# Patient Record
Sex: Female | Born: 1935 | Race: White | Hispanic: No | Smoking: Never smoker
Health system: Southern US, Community
[De-identification: ages and names within clinical notes are randomized; demographics above are authoritative.]

## PROBLEM LIST (undated history)

## (undated) DIAGNOSIS — E785 Hyperlipidemia, unspecified: Secondary | ICD-10-CM

## (undated) DIAGNOSIS — E039 Hypothyroidism, unspecified: Secondary | ICD-10-CM

## (undated) DIAGNOSIS — I10 Essential (primary) hypertension: Secondary | ICD-10-CM

## (undated) HISTORY — DX: Hyperlipidemia, unspecified: E78.5

## (undated) HISTORY — DX: Hypothyroidism, unspecified: E03.9

## (undated) HISTORY — DX: Essential (primary) hypertension: I10

## (undated) HISTORY — PX: TONSILLECTOMY: SUR1361

## (undated) HISTORY — PX: APPENDECTOMY: SHX54

---

## 2002-04-14 ENCOUNTER — Ambulatory Visit (HOSPITAL_COMMUNITY): Admission: RE | Admit: 2002-04-14 | Discharge: 2002-04-14 | Payer: Self-pay | Admitting: Orthopedic Surgery

## 2002-04-16 ENCOUNTER — Inpatient Hospital Stay (HOSPITAL_COMMUNITY): Admission: RE | Admit: 2002-04-16 | Discharge: 2002-04-22 | Payer: Self-pay | Admitting: Orthopedic Surgery

## 2002-04-16 ENCOUNTER — Encounter: Payer: Self-pay | Admitting: Orthopedic Surgery

## 2010-03-08 ENCOUNTER — Encounter: Admission: RE | Admit: 2010-03-08 | Discharge: 2010-03-08 | Payer: Self-pay | Admitting: Sports Medicine

## 2010-03-08 IMAGING — CR DG KNEE 1-2V*L*
2 series · 2 of 2 positions shown · non-contrast
Comparison: None.

CLINICAL DATA: Left knee pain for 2 weeks, no acute injury

LEFT KNEE - 1-2 VIEW

[view not recorded (1 of 2)]
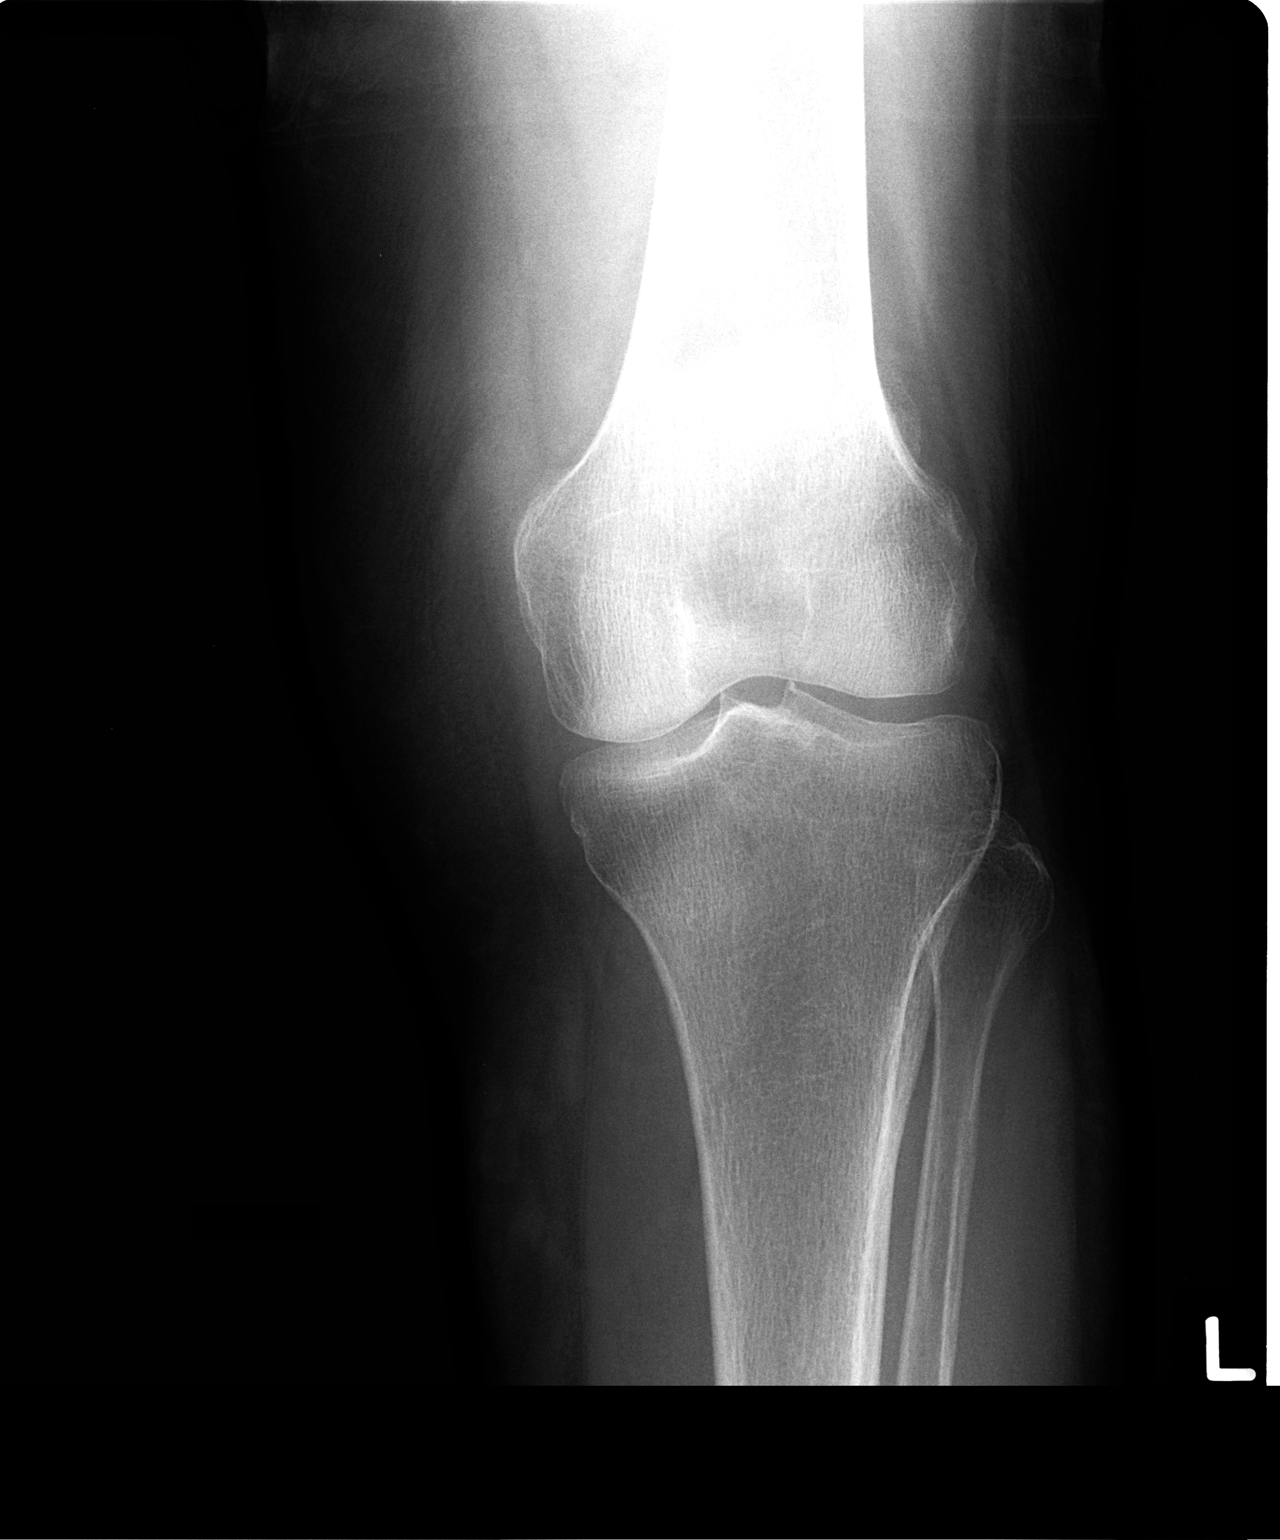

[view not recorded (2 of 2)]
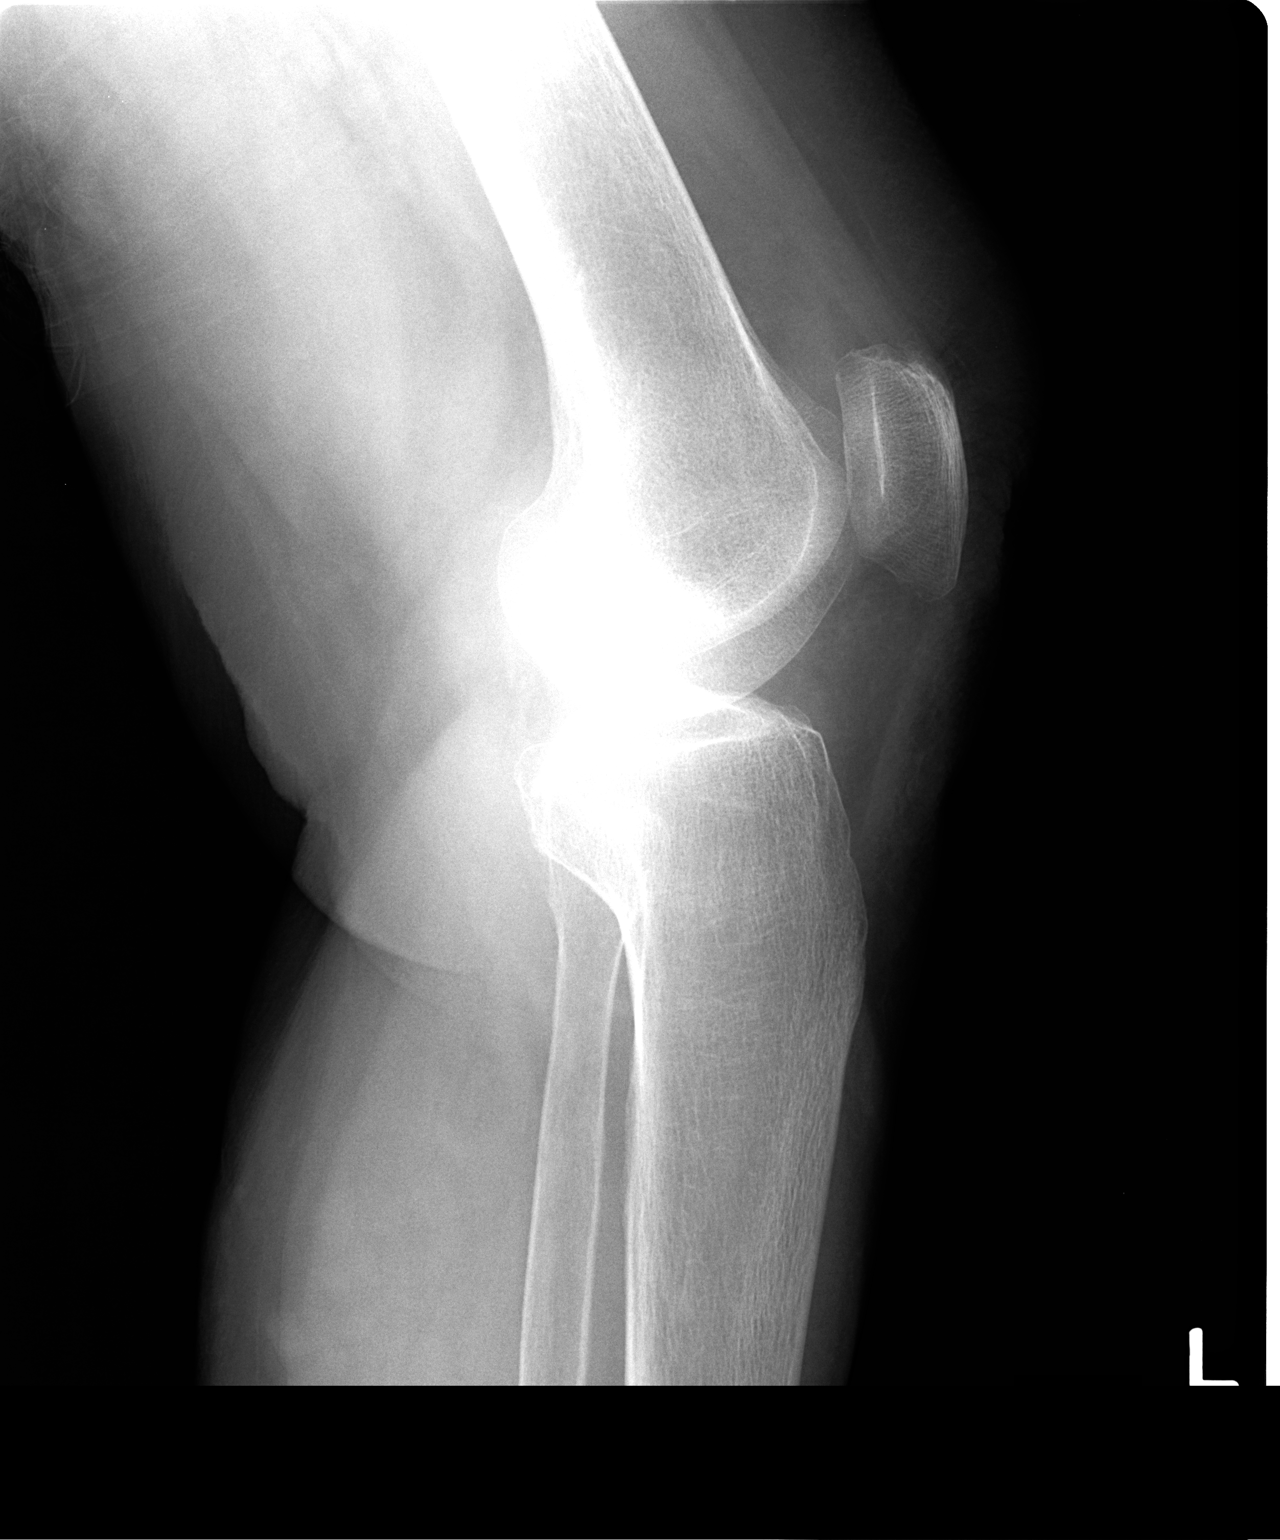

[2 of 2 positions shown; findings below may reference images not displayed]

FINDINGS: The bones are slightly osteopenic.  Joint spaces appear
normal.  No fracture is seen.  No effusion is noted.
IMPRESSION: Mild osteopenia.  No acute abnormality.

## 2010-11-17 NOTE — Op Note (Signed)
[name], [name]                            ACCOUNT NO.:  [id number]   MEDICAL RECORD NO.:  [id number]                   PATIENT TYPE:  INP   LOCATION:  5032                                 FACILITY:  [facility]   PHYSICIAN:  Deidre Ala, M.D.                 DATE OF BIRTH:  07/29/1924   DATE OF PROCEDURE:  04/16/2002  DATE OF DISCHARGE:                                 OPERATIVE REPORT   PREOPERATIVE DIAGNOSES:  1. Left ankle anterior plafond fracture, 50% with displacement.  2. Medial malleolar fracture with displacement.   OPERATION:  1. Left ankle manipulative reduction with open percutaneous cannulated     cancellous screws, three anterior distal tibia front to back plafond.  2. Reduction and two cannulated cancellous screws medial malleolus.  3. Intraoperative fluoroscopy.   SURGEON:  Bradley Ferris, M.D.   ASSISTANT:  Madilyn Fireman, PA-C   ANESTHESIA:  General endotracheal.   CULTURES:  None.   DRAINS:  None.   ESTIMATED BLOOD LOSS:  Minimal.   TOURNIQUET TIME:  47 minutes.   PATHOLOGIC FINDINGS AND HISTORY:  Christina [name] is a 77 year old, who fell off a  ladder five days ago.  She was referred by Stan Head. Cleta Alberts, M.D., [facility].  She was cleaning gutters.  This was April 12, 2002, actually  it was four days ago.  X-rays revealed a low-impact type anterior malleolus  fracture, splitting 50% of the joint surface without offset but separated  about 45 mm into the plafond and a medial malleolus fracture that was  slightly varus.  The fibula was intact.  The posterior malleolus was intact.  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  Preoperative Doppler did not show a clot, but we will continue on  it postoperatively with Lovenox until she is fully coumadinized.  At  surgery, we were able to put traction on the heel and manipulation and  achieve an anatomic reduction of both the medial malleolus as well as the  anterior malleolus plafond.  We placed three front to back screws slightly  oblique to one another on the plafond, anterior malleolar fracture, and two  screws in the median malleolar fracture, both 72 mm length.  The anterior-  posterior screws for the plafond through the back cortex.  No washers were  used; 4.5 cannulated cancellous screws were used with anatomic reduction  obtained using intraoperative fluoroscopy.   DESCRIPTION OF PROCEDURE:  With adequate anesthesia obtained using  endotracheal technique, 1 g of Ancef given IV prophylaxis, the patient was  placed in a supine position, and the left lower extremity was prepped from  the toes to the knee in the standard fashion.  After standard prepping and  draping, Esmarch exsanguination was used and tourniquet inflated up to 350  mmHg.  I then  manipulated the fracture, pulled traction, and checked with  the C-arm that we had reduced the fractures, and we had.  We then placed  guidepins across the plafond fracture from anterior to posterior x 2.  We  then placed the guidepins up across the medial malleolar fracture.  We  measured length, then drilled, and then placed the 4.5 short threaded  cannulated screws in the medial malleolus and anterior-posterior on the  plafond and then placed an additional screw at a slightly oblique angle to  the others.  C-arm fluoroscopy confirmed position on all views.  We then  closed the wounds with 4-0 nylon.  Bulky sterile compressive dressing was  applied in ankle neutral with splints with U-stirrup and posterior splint  padded to prevent sticking of the splints together so that the dressing  could expand.  Ace wrap placed.  The patient was awakened and taken to  recovery room in satisfactory condition to be admitted for routine  postoperative care and physical therapy, crutches, nonweightbearing, and  analgesia.  We did take C-arm fluoroscopy in the splint, and we maintained  our position.  We did  not feel this was high impact or highly comminuted to  need an external fixator.                                               Deidre Ala, M.D.    VEP/MEDQ  D:  04/16/2002  T:  04/17/2002  Job:  [id number]   cc:   Brett Canales A. Cleta Alberts, M.D.

## 2010-11-17 NOTE — Op Note (Signed)
Christina [name], Christina [name]                            ACCOUNT NO.:  [id number]   MEDICAL RECORD NO.:  [id number]                   PATIENT TYPE:  INP   LOCATION:  5032                                 FACILITY:  [facility]   PHYSICIAN:  Deidre Ala, M.D.                 DATE OF BIRTH:  1925-05-20   DATE OF PROCEDURE:  04/16/2002  DATE OF DISCHARGE:  04/22/2002                                 OPERATIVE REPORT   PREOPERATIVE DIAGNOSIS:  Left tibial plafond fracture displaced anteriorly  and medial malleolar fracture.   POSTOPERATIVE DIAGNOSIS:  Left tibial plafond fracture displaced anteriorly  and medial malleolar fracture.   PROCEDURE:  Open reduction and cannulated cancellous screw fixation of  tibial plafond and medial malleolus.   SURGEON:  Bradley Ferris, M.D.   ASSISTANT:  Madilyn Fireman, P.A.-C.   ANESTHESIA:  General endotracheal.   ESTIMATED BLOOD LOSS:  Minimal.   TOURNIQUET TIME:  47 minutes.   FLUIDS REPLACED:  Without.   INDICATIONS FOR PROCEDURE:  The patient sustained a low velocity, low  energy, osteoporotic fracture of the tibial plafond with a split anterior  posterior in the medial malleolus.  We took her to the operating room,  placed her in traction on the table and with the C-arm closed reduced the  fractures and placed two anterior posterior 4.5 cannulated cancellous screws  and two medial malleolar cannulated cancellous screws percutaneously to fix  the fracture anatomically.   DESCRIPTION OF PROCEDURE:  With adequate anesthesia obtained using  endotracheal technique, 1 gram Ancef given IV prophylaxis. The patient was  placed in the supine position.  The left lower extremity was prepped from  the toes to the knee in a standard fashion. After standard prepping and  draping, Esmarch exsanguination was used, the tourniquet was let up to 350  mmHg.  Manipulative traction was then carried out. Reduction was obtained  and using a guide pin percutaneously, two  anterior posterior on a sagittal  split fracture was carried out on the plafond and two across the medial  malleolus with satisfactory position and alignment of compression noted  after drilling and  placement of the 4.5 cannulated cancellous screw.  The wounds were closed  with 4-0 nylon. A bulky sterile compressive dressing was applied with  Ustirrup splint.  The patient having tolerated the procedure well was  awakened, taken to the recovery room in satisfactory condition for routine  postoperative care.                                               Deidre Ala, M.D.    VEP/MEDQ  D:  08/01/2002  T:  08/01/2002  Job:  [id number]   cc:   Fax [phone] [place], [facility]

## 2010-11-17 NOTE — Discharge Summary (Signed)
NAMEMARLEAN, [name]                            ACCOUNT NO.:  [id number]   MEDICAL RECORD NO.:  [id number]                   PATIENT TYPE:  INP   LOCATION:  5032                                 FACILITY:  [facility]   PHYSICIAN:  Christina [name], M.D.                 DATE OF BIRTH:  06-03-1925   DATE OF ADMISSION:  04/16/2002  DATE OF DISCHARGE:  04/22/2002                                 DISCHARGE SUMMARY   ADMISSION DIAGNOSIS:  Left distal tibial fracture.   DISCHARGE DIAGNOSIS:  Left distal tibial fracture, status post closed  reduction and percutaneous screw repair of such fracture.   SUMMARY:  The patient suffered a fall off of a ladder while cleaning out her  gutters on the 12th of October 2003, was seen in our office the following  morning, after being seen at an urgent care center, and was set up for  admission on the 16th of October 2003 to [facility], where she  underwent fixation of her fracture.  She was taken to the operating room  under general anesthesia, with a tourniquet time of 47 minutes.  Her left  ankle was closed-reduced and the tibial plafond and medial malleolar  fractures were closed-reduced and percutaneously fixated with cannulated  cancellous screws.  There was minimal estimated blood loss and she was taken  to the recovery room in stable condition.  On the 17th of October 2003,  postoperative day 1, she was having a lot of left foot pain when she moved  her leg or when it was dependent, but no difficulties with it lying in  elevation.  Her vitals were stable.  She had had a maximum temperature of  100.2.  The splint was intact without any evidence of active drainage.  The  lower extremity was intact neurovascularly.  She had a PT of 14.1 with an  INR of 1.1.  Coumadin and Lovenox were started per protocol.  The initial  plan was for her to be nonweightbearing for 8 to 10 weeks postoperative and  then have her ambulating with a walker, out of bed to  chair for the first  day or two, and then improving her ambulation status with physical therapy  and anticipating discharging her to a skilled nursing facility the beginning  of the week.  PT, OT and rehab consults were obtained, as well as Care  Management for discharge planning.  On the 17th of October, the family  expressed some concerns about her mental health, stating that she had made  some suicidal ideations and statements; a psychiatry consult was obtained.  Psychiatrist was recommending, even though she had taken Paxil at a high  dose in the past and had had problems with agitation and insomnia, due to  this, she was still recommending starting a low dose of 5 mg per day and  increasing by 5 mg per  week as tolerated, to get her to a dose of 20 to 40  mg per day, at which point we could change her to the Paxil CR.  On the 18th  of October 2003, postoperative day 2, she was having a little bit more pain,  maximum temperature of 100.4, with stable vital signs; INR was 1.3.  The  cast was still intact with no evidence of drainage.  We continued her  nonweightbearing and were awaiting the results of previous-mentioned  consults.  On the 19th of October, she was afebrile with stable vital signs,  neurovascularly intact, with the splint clean, dry and intact.  On the 20th  of October 2003, postoperative day 4, she was having a little bit of anxiety  and frustration at her nonambulatory status; she had a history of living  alone and doing things by herself.  She leads a very active lifestyle.  Otherwise, she was without complaints, vitals were stable, maximum  temperature of 100.2.  Splint was intact.  Lower extremity was  neurovascularly intact.  She was awake, alert and appropriate in discussion.  Her affect had been more appropriate.  Her PT was 16.1, INR was 1.3.  Expected plan with results of the rehab consult was that she was going to be  going to a skilled nursing facility or an  [facility]C-type place for about 10 to 12  weeks and then recommended therapy on regaining her mobility.  Rehab  admission was denied and she was basically holding, waiting for a skilled  nursing facility placement.  On the 21st of October 2003, postoperative day  5, she was without complaints.  She had had some pain the night before, a  little bit of agitation, difficulty sleeping.  In the morning, her vitals  were stable, she was afebrile, her PT was 17.2 with an INR of 1.5.  The plan  was to continue her on the Lovenox ____________ greater than 1.8, with a  goal INR of 2.0 to 2.5 for the next two months.  On the 22nd of October  2003, she was without complaints.  She was having less agitation with the  Paxil, she slept better, vitals were stable, afebrile.  Plan was to  discharge her to the skilled nursing facility.   INSTRUCTIONS UPON DISCHARGE:  Activity is nonweightbearing to left leg,  ambulate with walker with PT assistance.   DIET:  Diet is regular as tolerated.   WOUND CARE:  Wound instructions are to keep it clean and dry, elevation and  ice to control swelling, staples out and splint removed and changed to short  leg cast or a Cam boot on the 3rd of November 2003 in our office.   SPECIAL INSTRUCTIONS:  Special instructions were to call for increasing  pain, drainage or bleeding, numbness or tingling, coughing, shortness of  breath, fever greater than 100.5 degrees or chills.   MEDICATIONS UPON DISCHARGE:  1. Synthroid 50 mcg one p.o. every day.  2. Paxil 5 mg one p.o. every day at 1800 hours, can increase this dose by 5     mg per week as tolerated regarding side-effects until her dose is up to     20 mg one p.o. every day, at which point, switch it to 12.5 mg of Paxil     CR one p.o. every day.  3. Coumadin per protocol/Lovenox per protocol until INR greater than 1.8, at     which point discontinue.  4. Percocet 325/5 mg one to  two p.o. q.6h. p.r.n. pain. 5. Phenergan 25 mg  one p.o. q.6h. p.r.n. nausea.  6. Benadryl 25 mg one p.o. q.8h. p.r.n. itching.   FOLLOWUP:  Her followup instructions were Monday, May 04, 2002, Dr. Deidre [name] in his office; please call for an appointment, [phone].   CONDITION UPON DISCHARGE:  Her condition upon discharge is stable.     Madilyn Fireman, PA-C                          Christina [name], M.D.    AC/MEDQ  D:  04/22/2002  T:  04/22/2002  Job:  [id number]

## 2011-04-16 DIAGNOSIS — F4310 Post-traumatic stress disorder, unspecified: Secondary | ICD-10-CM | POA: Insufficient documentation

## 2011-04-16 DIAGNOSIS — G2581 Restless legs syndrome: Secondary | ICD-10-CM | POA: Insufficient documentation

## 2011-04-16 DIAGNOSIS — F419 Anxiety disorder, unspecified: Secondary | ICD-10-CM | POA: Insufficient documentation

## 2011-04-16 DIAGNOSIS — K219 Gastro-esophageal reflux disease without esophagitis: Secondary | ICD-10-CM | POA: Insufficient documentation

## 2011-04-16 DIAGNOSIS — F32A Depression, unspecified: Secondary | ICD-10-CM | POA: Insufficient documentation

## 2012-02-05 DIAGNOSIS — R002 Palpitations: Secondary | ICD-10-CM | POA: Insufficient documentation

## 2012-02-28 DIAGNOSIS — I491 Atrial premature depolarization: Secondary | ICD-10-CM | POA: Insufficient documentation

## 2013-04-28 ENCOUNTER — Ambulatory Visit (INDEPENDENT_AMBULATORY_CARE_PROVIDER_SITE_OTHER): Payer: PRIVATE HEALTH INSURANCE

## 2013-04-28 ENCOUNTER — Ambulatory Visit (INDEPENDENT_AMBULATORY_CARE_PROVIDER_SITE_OTHER): Payer: PRIVATE HEALTH INSURANCE | Admitting: Family Medicine

## 2013-04-28 ENCOUNTER — Encounter: Payer: Self-pay | Admitting: Family Medicine

## 2013-04-28 VITALS — BP 154/78 | HR 121 | Temp 98.0°F | Wt 151.0 lb

## 2013-04-28 DIAGNOSIS — R059 Cough, unspecified: Secondary | ICD-10-CM

## 2013-04-28 DIAGNOSIS — E785 Hyperlipidemia, unspecified: Secondary | ICD-10-CM | POA: Insufficient documentation

## 2013-04-28 DIAGNOSIS — R05 Cough: Secondary | ICD-10-CM

## 2013-04-28 DIAGNOSIS — I10 Essential (primary) hypertension: Secondary | ICD-10-CM | POA: Insufficient documentation

## 2013-04-28 DIAGNOSIS — J9819 Other pulmonary collapse: Secondary | ICD-10-CM

## 2013-04-28 DIAGNOSIS — J441 Chronic obstructive pulmonary disease with (acute) exacerbation: Secondary | ICD-10-CM

## 2013-04-28 IMAGING — CR DG CHEST 2V
2 series · 2 of 2 positions shown · non-contrast
Comparison: None.

CLINICAL DATA: Cough, wheezing, congestion

EXAM:
CHEST  2 VIEW

[view not recorded (1 of 2)]
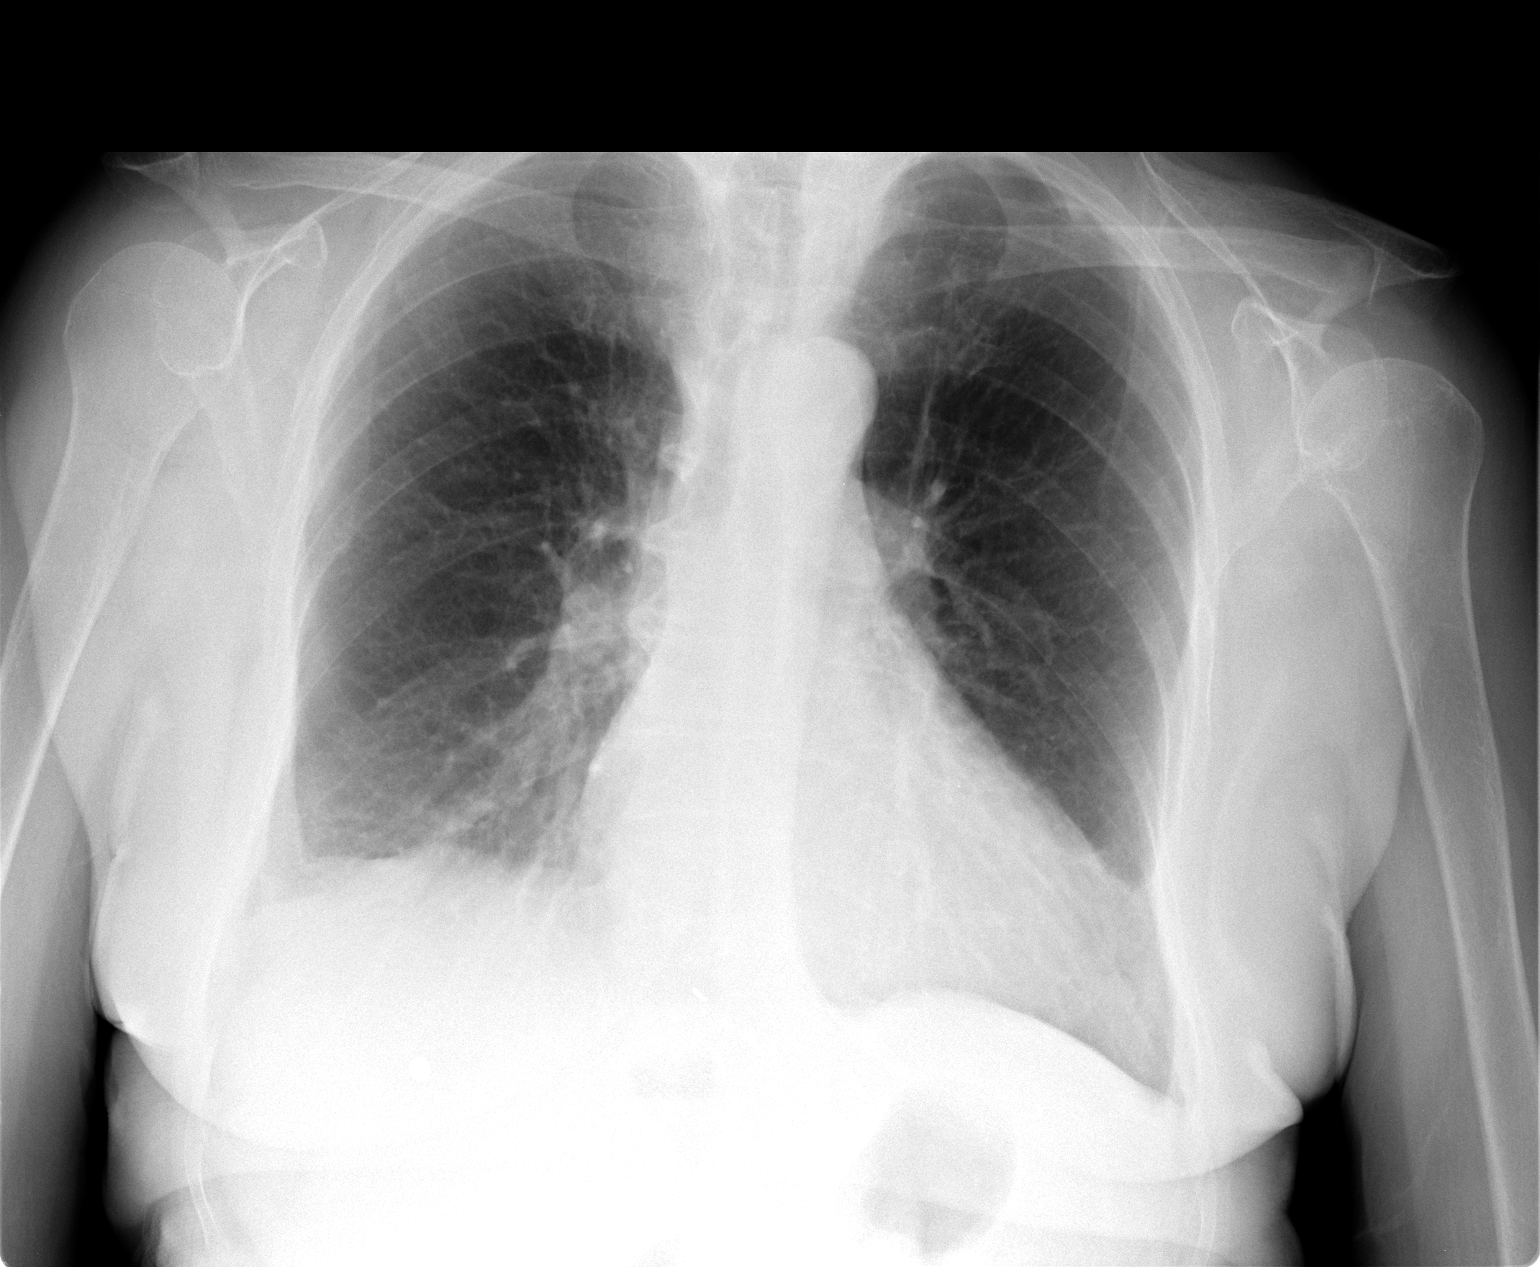

[view not recorded (2 of 2)]
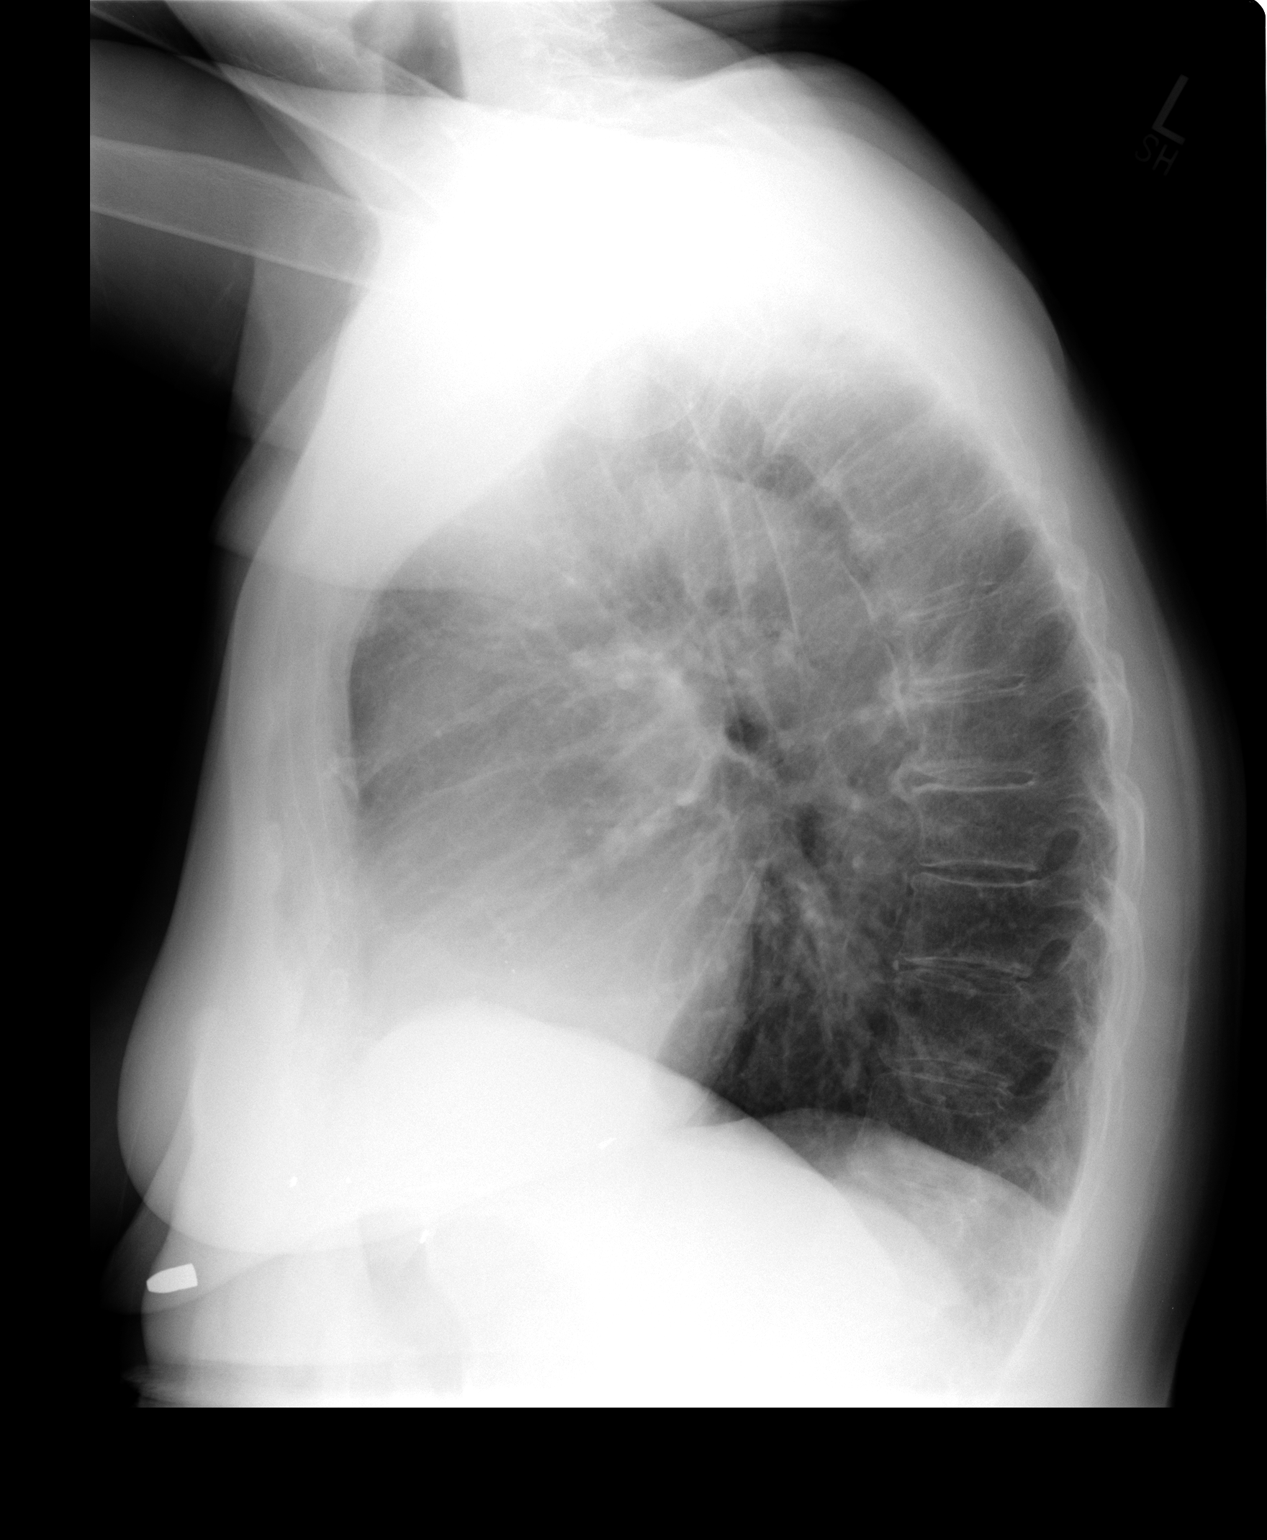

[2 of 2 positions shown; findings below may reference images not displayed]

FINDINGS: Slight elevation of the right hemidiaphragm. Minimal right base
atelectasis. Left lung is clear. Heart is upper limits normal in
size. No effusions. No acute bony abnormality.
IMPRESSION: Minimal right base atelectasis.

## 2013-04-28 MED ORDER — AZITHROMYCIN 250 MG PO TABS: ORAL_TABLET | ORAL | Status: AC

## 2013-04-28 MED ORDER — ALBUTEROL SULFATE HFA 108 (90 BASE) MCG/ACT IN AERS: INHALATION_SPRAY | RESPIRATORY_TRACT | Status: DC

## 2013-04-28 MED ORDER — PREDNISONE 20 MG PO TABS: ORAL_TABLET | ORAL | Status: AC

## 2013-04-28 NOTE — Progress Notes (Signed)
CC: Christina [name] is a 79 y.o. female is here for Establish Care   Subjective: HPI:  Complaints nonproductive cough and wheezing that has been present since Sunday worsen on a daily basis present all hours of the day seems to be worse at night. Symptoms are described as moderate in severity. No interventions as of yet. She was seen at a local pharmacy and sent to [place] for concerns of pneumonia. Patient reports feeling fatigued from coughing at night, poor sleep, but denies subjective fevers, chills, nor night sweats. She denies chest pain, orthopnea, PND, nor peripheral edema.  Years ago she was prescribed an inhaler that she would use with exertion she's unsure whether or not she has ever had a diagnosis of asthma or obstructive lung disease. Denies blood in sputum and has been a nonsmoker all life. Denies nasal congestion or facial pressure  Review of Systems - General ROS: negative for - chills, fever, night sweats, weight gain or weight loss Ophthalmic ROS: negative for - decreased vision Psychological ROS: negative for - anxiety or depression ENT ROS: negative for - hearing change, nasal congestion, tinnitus or allergies Hematological and Lymphatic ROS: negative for - bleeding problems, bruising or swollen lymph nodes Breast ROS: negative Cardiovascular ROS: no chest pain or dyspnea on exertion Gastrointestinal ROS: no abdominal pain, change in bowel habits, or black or bloody stools Genito-Urinary ROS: negative for - genital discharge, genital ulcers, incontinence or abnormal bleeding from genitals Musculoskeletal ROS: negative for - joint pain or muscle pain Neurological ROS: negative for - headaches or memory loss Dermatological ROS: negative for lumps, mole changes, rash and skin lesion changes  Past Medical History  Diagnosis Date  . Hypertension   . Hyperlipidemia      History reviewed. No pertinent family history.   History  Substance Use Topics  . Smoking status: Never Smoker    . Smokeless tobacco: Not on file  . Alcohol Use: No     Objective: Filed Vitals:   04/28/13 1333  BP: 154/78  Pulse: 121  Temp: 98 F (36.7 C)    General: Alert and Oriented, No Acute Distress HEENT: Pupils equal, round, reactive to light. Conjunctivae clear.  External ears unremarkable, canals clear with intact TMs with appropriate landmarks.  Middle ear appears open without effusion. Pink inferior turbinates.  Moist mucous membranes, pharynx without inflammation nor lesions.  Neck supple without palpable lymphadenopathy nor abnormal masses. Lungs: Comfortable work of breathing, there is mild diffuse end expiratory wheezing in all lung fields, no rales or rhonchi. No signs of consolidation  Cardiac: Regular rate and rhythm. Normal S1/S2.  No murmurs, rubs, nor gallops.   Extremities: No peripheral edema.  Strong peripheral pulses.  Mental Status: No depression, anxiety, nor agitation. Skin: Warm and dry.  Assessment & Plan: Oreta was seen today for establish care.  Diagnoses and associated orders for this visit:  Cough - DG Chest 2 View; Future  COPD exacerbation - predniSONE (DELTASONE) 20 MG tablet; Two tabs at once daily for five days. - azithromycin (ZITHROMAX) 250 MG tablet; Take two tabs at once on day 1, then one tab daily on days 2-5. - albuterol (PROVENTIL HFA;VENTOLIN HFA) 108 (90 BASE) MCG/ACT inhaler; Inhale two puffs every 4-6 hours only as needed for shortness of breath or wheezing.     Given her remote albuterol use would suspect she probably has an obstructive lung disease such as asthma or COPD, therefore treating a COPD exacerbation. Chest x-ray obtained ruling out pneumonia. Start medications  above.Signs and symptoms requring emergent/urgent reevaluation were discussed with the patient.   Return if symptoms worsen or fail to improve.

## 2014-06-14 ENCOUNTER — Encounter: Payer: Self-pay | Admitting: Family Medicine

## 2014-06-14 ENCOUNTER — Ambulatory Visit (INDEPENDENT_AMBULATORY_CARE_PROVIDER_SITE_OTHER): Payer: 59 | Admitting: Family Medicine

## 2014-06-14 VITALS — BP 164/81 | HR 119 | Wt 150.0 lb

## 2014-06-14 DIAGNOSIS — R252 Cramp and spasm: Secondary | ICD-10-CM

## 2014-06-14 DIAGNOSIS — M1711 Unilateral primary osteoarthritis, right knee: Secondary | ICD-10-CM

## 2014-06-14 DIAGNOSIS — M1712 Unilateral primary osteoarthritis, left knee: Secondary | ICD-10-CM

## 2014-06-14 DIAGNOSIS — I10 Essential (primary) hypertension: Secondary | ICD-10-CM

## 2014-06-14 DIAGNOSIS — E875 Hyperkalemia: Secondary | ICD-10-CM

## 2014-06-14 MED ORDER — CELECOXIB 100 MG PO CAPS: 100.0000 mg | ORAL_CAPSULE | Freq: Two times a day (BID) | ORAL | Status: DC | PRN

## 2014-06-15 ENCOUNTER — Telehealth: Payer: Self-pay | Admitting: *Deleted

## 2014-06-15 MED ORDER — MELOXICAM 15 MG PO TABS: 7.5000 mg | ORAL_TABLET | Freq: Every day | ORAL | Status: DC | PRN

## 2014-06-15 NOTE — Progress Notes (Signed)
CC: Christina LippsMaxine [name] is a 80 y.o. female is here for right knee pain   Subjective: HPI:  Patient complains of pain in the left knee and right knee that has been present for matter of years but worsening over the past few weeks. So those are moderate in severity worse after periods of inactivity. Symptoms improve 10-15 minutes after she gets up and moves around. No interventions as of yet. She denies any catching locking or giving way. She denies joint pain elsewhere.She denies any warmth or overlying skin changes. She denies any recent or remote trauma.  Complains of cramping in both lower extremities that occurs only at rest. She denies any exertional influenced the cramping. It occurs on a nightly basis and worse when trying to fall asleep but nice. It is localized to the calves. No interventions as of yet. She brings in blood work from July 2015 showing a hypercalcemia of 10.5 and a hyperkalemia of 4.7. She never had any follow-up on this. She is not sure whether or not she was on lisinopril when this was measured. Nothing particularly makes the cramping better or worse. She denies cramping elsewhere in her body. Symptoms are improved she gets up and walks around and stretches.  Follow-up essential hypertension: She continues to take lisinopril on a daily basis with no outside blood pressures to report. There has been no chest pain, shortness breath, orthopnea nor peripheral edema.  Review Of Systems Outlined In HPI  Past Medical History  Diagnosis Date  . Hypertension   . Hyperlipidemia     No past surgical history on file. No family history on file.  History   Social History  . Marital Status: Widowed    Spouse Name: N/A    Number of Children: N/A  . Years of Education: N/A   Occupational History  . Not on file.   Social History Main Topics  . Smoking status: Never Smoker   . Smokeless tobacco: Not on file  . Alcohol Use: No  . Drug Use: No  . Sexual Activity: No   Other  Topics Concern  . Not on file   Social History Narrative     Objective: BP 164/81 mmHg  Pulse 119  Wt 150 lb (68.04 kg)  General: Alert and Oriented, No Acute Distress HEENT: Pupils equal, round, reactive to light. Conjunctivae clear.  Moist mucous membranes pharynx unremarkable Lungs: Clear to auscultation bilaterally, no wheezing/ronchi/rales.  Comfortable work of breathing. Good air movement. Cardiac: Regular rate and rhythm. Normal S1/S2.  No murmurs, rubs, nor gallops.   Right knee exam shows full-strength and range of motion. There is no swelling, redness, nor warmth overlying the knee.  No patellar crepitus. No patellar apprehension. No pain with palpation of the inferior patellar pole.  No pain or laxity with valgus nor varus stress. Anterior drawer is negative.No popliteal space tenderness or palpable mass. bilateral medial andlateral joint line tenderness to palpation. Left knee exam full-strength and range of motion. There is no swelling, redness, nor warmth overlying the knee.  No patellar crepitus. No patellar apprehension. No pain with palpation of the inferior patellar pole.  No pain or laxity with valgus nor varus stress. Anterior drawer is negative.No popliteal space tenderness or palpable mass. bilateral medial andlateral joint line tenderness to palpation.Extremities: No peripheral edema.  Strong peripheral pulses.  Mental Status: No depression, anxiety, nor agitation. Skin: Warm and dry.  Assessment & Plan: Christina [name] was seen today for right knee pain.  Diagnoses and associated orders  for this visit:  Primary osteoarthritis of left knee - Discontinue: celecoxib (CELEBREX) 100 MG capsule; Take 1 capsule (100 mg total) by mouth 2 (two) times daily as needed (knee pain).  Primary osteoarthritis of right knee - Discontinue: celecoxib (CELEBREX) 100 MG capsule; Take 1 capsule (100 mg total) by mouth 2 (two) times daily as needed (knee pain).  Cramp in lower  leg  Hyperkalemia  Hypercalcemia  Essential hypertension, benign  Other Orders - meloxicam (MOBIC) 15 MG tablet; Take 0.5-1 tablets (7.5-15 mg total) by mouth daily as needed (knee pain).    Osteoarthritis of the knees: I recommended that she begin Celebrex however she is unable to afford this because her insurance company does not provide good coverage for this. Meloxicam has been sent in for a substitution of this. Cramping of the lower legs: I reviewed her most recent blood work with her showing that her calcium and potassium was off which could be contributed to these cramping. I recommended that she have a complete metabolic panel checked today however despite discussing my logic behind rechecking this she adamantly refuses to have blood work today. I've let her know that I think that her cramping is due to electrolyte imbalance and at one she's willing to do a complete metabolic panel and provide her with more recommendations. Essential hypertension: Uncontrolled chronic condition, I would like to increase her lisinopril however I need to check her potassium before doing this., Again she is not willing to do a complete metabolic panel because she thinks it's too soon since she had one in July  40 minutes spent face-to-face during visit today of which at least 50% was counseling or coordinating care regarding: 1. Primary osteoarthritis of left knee   2. Primary osteoarthritis of right knee   3. Cramp in lower leg   4. Hyperkalemia   5. Hypercalcemia   6. Essential hypertension, benign        Return in about 4 weeks (around 07/12/2014) for Knee Pain.

## 2014-06-15 NOTE — Telephone Encounter (Signed)
Pt left a message that celebrex is no longer covered by insurance.

## 2014-06-15 NOTE — Telephone Encounter (Signed)
Meloxicam which is another anti-inflammatory has been sent to her pharmacy as a substitution for Celebrex. I would recommend that she follows up with me in 2 weeks, we will need to do blood work at that visit to recheck potassium and calcium

## 2014-06-16 NOTE — Telephone Encounter (Signed)
Pt.notified

## 2015-01-24 ENCOUNTER — Telehealth: Payer: Self-pay | Admitting: *Deleted

## 2015-01-24 DIAGNOSIS — E785 Hyperlipidemia, unspecified: Secondary | ICD-10-CM

## 2015-01-24 DIAGNOSIS — I10 Essential (primary) hypertension: Secondary | ICD-10-CM

## 2015-01-24 NOTE — Telephone Encounter (Signed)
Labs ordered per pt request and sent to [facility] in [place].

## 2015-01-25 LAB — BASIC METABOLIC PANEL
BUN: 18 mg/dL (ref 4–21)
Creatinine: 0.8 mg/dL (ref 0.5–1.1)
GLUCOSE: 90 mg/dL
Potassium: 4.9 mmol/L (ref 3.4–5.3)
Sodium: 144 mmol/L (ref 137–147)

## 2015-01-25 LAB — HEPATIC FUNCTION PANEL
ALK PHOS: 101 U/L (ref 25–125)
ALT: 9 U/L (ref 7–35)
AST: 12 U/L — AB (ref 13–35)

## 2015-01-25 LAB — LIPID PANEL
CHOLESTEROL: 230 mg/dL — AB (ref 0–200)
HDL: 59 mg/dL (ref 35–70)
LDL CALC: 143 mg/dL
Triglycerides: 141 mg/dL (ref 40–160)

## 2015-01-27 ENCOUNTER — Telehealth: Payer: Self-pay | Admitting: Family Medicine

## 2015-01-27 NOTE — Telephone Encounter (Signed)
Christina [name], Will you please let patient know that her kidney function, blood sugar, liver function and triglycerides were all normal.  Cholesterol is moderately elevated but for her age it's not high enough to warrant cholesterol lowering medication.  Her calcium was elevated again and I'd recommend she have another test done to see if this is coming from an overactive parathyroid gland.  If not sorted out this could lead to osteoporosis along with many other health issues. Lab slip in your inbox.

## 2015-01-28 NOTE — Telephone Encounter (Signed)
Pt notified and she will pick up lab slip next week at her appt. Per her insurance she has to go [facility]

## 2015-02-02 ENCOUNTER — Ambulatory Visit (INDEPENDENT_AMBULATORY_CARE_PROVIDER_SITE_OTHER): Payer: 59 | Admitting: Family Medicine

## 2015-02-02 ENCOUNTER — Encounter: Payer: Self-pay | Admitting: Family Medicine

## 2015-02-02 DIAGNOSIS — Z13820 Encounter for screening for osteoporosis: Secondary | ICD-10-CM

## 2015-02-02 DIAGNOSIS — Z0000 Encounter for general adult medical examination without abnormal findings: Secondary | ICD-10-CM

## 2015-02-02 MED ORDER — LISINOPRIL 10 MG PO TABS: 10.0000 mg | ORAL_TABLET | Freq: Every day | ORAL | Status: DC

## 2015-02-02 NOTE — Progress Notes (Signed)
Subjective:    Christina [name] is a 81 y.o. female who presents for Medicare Annual/Subsequent preventive examination.  Preventive Screening-Counseling & Management  Tobacco History  Smoking status  . Never Smoker   Smokeless tobacco  . Not on file    Colonoscopy: Offered the patient however discussed low likelihood of benefit given her age. She is not interested in having this done. Papsmear: Recommended against this given her age. Patient in agreement Mammogram: Recommended against this given her age however offered it to her if she felt strongly about having it done. She agrees it is not necessary at her age.  DEXA: She tells me she had a diagnosis of osteopenia 10 or 20 years ago, no recent DEXA scan  Influenza Vaccine: Declined Pneumovax: Declined despite counseling Td/Tdap: Declined despite counseling Zoster: Declined despite counseling.    Problems Prior to Visit 1. Hypercalcemia   Current Problems (verified) Patient Active Problem List   Diagnosis Date Noted  . Hypercalcemia 01/27/2015  . Essential hypertension, benign 04/28/2013  . Hyperlipidemia 04/28/2013    Medications Prior to Visit Current Outpatient Prescriptions on File Prior to Visit  Medication Sig Dispense Refill  . aspirin 81 MG tablet Take 81 mg by mouth daily. Two tablets daily    . lisinopril (PRINIVIL,ZESTRIL) 10 MG tablet Take 10 mg by mouth daily.     No current facility-administered medications on file prior to visit.    Current Medications (verified) Current Outpatient Prescriptions  Medication Sig Dispense Refill  . aspirin 81 MG tablet Take 81 mg by mouth daily. Two tablets daily    . lisinopril (PRINIVIL,ZESTRIL) 10 MG tablet Take 10 mg by mouth daily.     No current facility-administered medications for this visit.     Allergies (verified) Sulfur   PAST HISTORY  Family History No family history on file.  Social History History  Substance Use Topics  . Smoking status:  Never Smoker   . Smokeless tobacco: Not on file  . Alcohol Use: No     Are there smokers in your home (other than you)? No  Risk Factors Current exercise habits: The patient does not participate in regular exercise at present.  Dietary issues discussed: DASH   Cardiac risk factors: advanced age (older than 4 for men, 57 for women), dyslipidemia and hypertension.  Depression Screen (Note: if answer to either of the following is "Yes", a more complete depression screening is indicated)   Over the past two weeks, have you felt down, depressed or hopeless? No  Over the past two weeks, have you felt little interest or pleasure in doing things? No  Have you lost interest or pleasure in daily life? No  Do you often feel hopeless? No  Do you cry easily over simple problems? No  Activities of Daily Living In your present state of health, do you have any difficulty performing the following activities?:  Driving? No Managing money?  No Feeding yourself? No Getting from bed to chair? No Climbing a flight of stairs? No Preparing food and eating?: No Bathing or showering? No Getting dressed: No Getting to the toilet? No Using the toilet:No Moving around from place to place: No In the past year have you fallen or had a near fall?:No   Are you sexually active?  No  Do you have more than one partner?  No  Hearing Difficulties: No Do you often ask people to speak up or repeat themselves? No Do you experience ringing or noises in your ears? No  Do you have difficulty understanding soft or whispered voices? No   Do you feel that you have a problem with memory? No  Do you often misplace items? No  Do you feel safe at home?  Yes  Cognitive Testing  Alert? Yes  Normal Appearance?Yes  Oriented to person? Yes  Place? Yes   Time? Yes  Recall of three objects?  Yes  Can perform simple calculations? Yes  Displays appropriate judgment?Yes  Can read the correct time from a watch  face?Yes   Advanced Directives have been discussed with the patient? Yes  List the Names of Other Physician/Practitioners you currently use: 1.    Indicate any recent Medical Services you may have received from other than [facility] providers in the past year (date may be approximate).   There is no immunization history on file for this patient.  Screening Tests Health Maintenance  Topic Date Due  . TETANUS/TDAP  03/22/1944  . COLONOSCOPY  03/22/1977  . ZOSTAVAX  03/23/1987  . DEXA SCAN  03/22/1992  . PNA vac Low Risk Adult (1 of 2 - PCV13) 03/22/1992  . INFLUENZA VACCINE  01/31/2015    All answers were reviewed with the patient and necessary referrals were made:  Laren Boom, DO   02/02/2015   History reviewed: allergies, current medications, past family history, past medical history, past social history, past surgical history and problem list  Review of Systems Review of Systems - General ROS: negative for - chills, fever, night sweats, weight gain or weight loss Ophthalmic ROS: negative for - decreased vision Psychological ROS: negative for - anxiety or depression ENT ROS: negative for - hearing change, nasal congestion, tinnitus or allergies Hematological and Lymphatic ROS: negative for - bleeding problems, bruising or swollen lymph nodes Breast ROS: negative Respiratory ROS: no cough, shortness of breath, or wheezing Cardiovascular ROS: no chest pain or dyspnea on exertion Gastrointestinal ROS: no abdominal pain, change in bowel habits, or black or bloody stools Genito-Urinary ROS: negative for - genital discharge, genital ulcers, incontinence or abnormal bleeding from genitals Musculoskeletal ROS: negative for - joint pain or muscle pain Neurological ROS: negative for - headaches or memory loss Dermatological ROS: negative for lumps, mole changes, rash and skin lesion changes   Objective:     Vision by Snellen chart: right eye:20/30, left eye:20/30  There is no height  on file to calculate BMI. BP 153/78 mmHg  Pulse 94  Wt 149 lb (67.586 kg)  General: No Acute Distress HEENT: Atraumatic, normocephalic, conjunctivae normal without scleral icterus.  No nasal discharge, hearing grossly intact, TMs with good landmarks bilaterally with no middle ear abnormalities, posterior pharynx clear without oral lesions. Neck: Supple, trachea midline, no cervical nor supraclavicular adenopathy. Pulmonary: Clear to auscultation bilaterally without wheezing, rhonchi, nor rales. Cardiac: Regular rate and rhythm.  No murmurs, rubs, nor gallops. No peripheral edema.  2+ peripheral pulses bilaterally. Abdomen: Soft nontender GU declined MSK: Grossly intact, no signs of weakness.  Full strength throughout upper and lower extremities.  Full ROM in upper and lower extremities.  No midline spinal tenderness. Neuro: Gait unremarkable, CN II-XII grossly intact.  C5-C6 Reflex 2/4 Bilaterally, L4 Reflex 2/4 Bilaterally.  Cerebellar function intact. Skin: No rashes. Psych: Alert and oriented to person/place/time.  Thought process normal. No anxiety/depression.      Assessment:     Overdue for shingles vaccine Pneumovax, Prevnar, tenderness posterior however declined.     Plan:     During the course of the visit the  patient was educated and counseled about appropriate screening and preventive services including:    Follow-up calcium and intact parathyroid hormone for hypercalcemia  Diet review for nutrition referral?   Not Indicated ____   Patient Instructions (the written plan) was given to the patient.  Medicare Attestation I have personally reviewed: The patient's medical and social history Their use of alcohol, tobacco or illicit drugs Their current medications and supplements The patient's functional ability including ADLs,fall risks, home safety risks, cognitive, and hearing and visual impairment Diet and physical activities Evidence for depression or mood  disorders  The patient's weight, height, BMI, and visual acuity have been recorded in the chart.  I have made referrals, counseling, and provided education to the patient based on review of the above and I have provided the patient with a written personalized care plan for preventive services.     Laren Boom, DO   02/02/2015

## 2015-02-03 LAB — PARATHYROID HORMONE, INTACT (NO CA)
Calcium, Ser: 10.6
PTH Interp: 55

## 2015-02-07 ENCOUNTER — Telehealth: Payer: Self-pay | Admitting: Family Medicine

## 2015-02-07 DIAGNOSIS — E213 Hyperparathyroidism, unspecified: Secondary | ICD-10-CM | POA: Insufficient documentation

## 2015-02-07 NOTE — Telephone Encounter (Signed)
Pt.notified

## 2015-02-07 NOTE — Telephone Encounter (Signed)
Sue Lush, Will you please let patient know that her labs last week would suggest she has an overactive parathyroid gland that's causing her calcium to be elevated.  I'll send a referral to ENT doctor for further management.

## 2015-03-22 ENCOUNTER — Telehealth: Payer: Self-pay | Admitting: *Deleted

## 2015-03-22 NOTE — Telephone Encounter (Signed)
She was referred for mild hyperparathyroidism which if untreated can lead to osteoporosis at an early age.  The physicians at [facility] will sometimes perform a surgery on the neck to remove one or more of the parathyroid glands in this situation.  Dr. Anne Hahn' note reflects that her level does not appear to need surgery at this time but she would benefit from seeing an Endocrinologist to see if her elevated parathyroid hormone is actually secondary to some other abnormality instead of being the primary issue at hand.  He also recommended getting a bone density scan to see if she's already lost a significant amount of bone density. I placed an order for this back in August and I'm now wondering if the radiology office has even called her to schedule this.    If she's not been contacted about seeing an endocrinologist can you place a referral to Dr. Shawnee Knapp in the [place] system (per Dr. Patrecia Pace' advice). If she's not been contacted about her bone density scan can you please call radiology to contact her?

## 2015-03-22 NOTE — Telephone Encounter (Signed)
Pt left a message that she went to go see a specialist and she was just very confused about what he told her and why she was even there. Pt wanted me to call her back and explain the visit to her. The only referral I see is to [facility]. Called the office and asked that they fax over progress notes for Hommel to review  Dr. Ivan Anchors when you receive the office notes can you please send me a phone note about what the provider's recommendations are since the patient was very confused?

## 2015-03-23 NOTE — Telephone Encounter (Signed)
Left message for pt to call back  °

## 2015-03-23 NOTE — Telephone Encounter (Signed)
Pt notified and voiced understanding  She did try to schedule a bone density test but she was told that they were getting a new machine at the end of September and they would call her back to schedule  Pt has already been referred to Dr. Shawnee Knapp and pt is aware

## 2015-03-29 ENCOUNTER — Encounter: Payer: Self-pay | Admitting: Family Medicine

## 2015-03-31 ENCOUNTER — Encounter: Payer: Self-pay | Admitting: Family Medicine

## 2015-04-14 ENCOUNTER — Ambulatory Visit (INDEPENDENT_AMBULATORY_CARE_PROVIDER_SITE_OTHER): Payer: 59

## 2015-04-14 DIAGNOSIS — M810 Age-related osteoporosis without current pathological fracture: Secondary | ICD-10-CM | POA: Diagnosis not present

## 2015-04-17 ENCOUNTER — Telehealth: Payer: Self-pay | Admitting: Family Medicine

## 2015-04-17 DIAGNOSIS — M810 Age-related osteoporosis without current pathological fracture: Secondary | ICD-10-CM | POA: Insufficient documentation

## 2015-04-17 MED ORDER — ALENDRONATE SODIUM 70 MG PO TABS: 70.0000 mg | ORAL_TABLET | ORAL | Status: DC

## 2015-04-17 NOTE — Telephone Encounter (Signed)
Sue Lushndrea, Will you please let patient know that her bone density scan revealed that the density of her bones are within the range of what would be considered osteoporosis. I would strongly recommend that she start on a bone building medication called alendronate.  Additionally, I'd recommend that she ensure she is consuming 1000 units of Vitamin D and 1200mg  of calcium either from a supplement or her diet.  This condition is a consequence of her overactive parathyroid gland.  (Will you please phone in the Rx associated with this phone note to her pharmacy of choice?)

## 2015-04-18 NOTE — Telephone Encounter (Signed)
Pt notified and rx called into pharm ([facility] [place])

## 2015-04-27 ENCOUNTER — Institutional Professional Consult (permissible substitution): Payer: 59 | Admitting: Sports Medicine

## 2015-05-04 ENCOUNTER — Other Ambulatory Visit: Payer: Self-pay

## 2015-05-04 MED ORDER — LISINOPRIL 10 MG PO TABS: 10.0000 mg | ORAL_TABLET | Freq: Every day | ORAL | Status: DC

## 2015-06-07 ENCOUNTER — Other Ambulatory Visit: Payer: Self-pay | Admitting: Family Medicine

## 2015-06-07 DIAGNOSIS — E039 Hypothyroidism, unspecified: Secondary | ICD-10-CM

## 2015-07-13 ENCOUNTER — Other Ambulatory Visit: Payer: Self-pay | Admitting: Family Medicine

## 2015-10-10 ENCOUNTER — Other Ambulatory Visit: Payer: Self-pay | Admitting: Family Medicine

## 2016-01-17 ENCOUNTER — Encounter: Payer: Self-pay | Admitting: Sports Medicine

## 2016-01-17 ENCOUNTER — Ambulatory Visit (INDEPENDENT_AMBULATORY_CARE_PROVIDER_SITE_OTHER): Payer: 59 | Admitting: Sports Medicine

## 2016-01-17 VITALS — BP 166/63 | HR 105 | Resp 18 | Wt 145.6 lb

## 2016-01-17 DIAGNOSIS — M170 Bilateral primary osteoarthritis of knee: Secondary | ICD-10-CM | POA: Diagnosis not present

## 2016-01-17 MED ORDER — LISINOPRIL 10 MG PO TABS: 10.0000 mg | ORAL_TABLET | Freq: Every day | ORAL | Status: DC

## 2016-01-17 NOTE — Assessment & Plan Note (Signed)
Moderate response to aleve, injection as above, return to see me in one month. We did discuss Visco supplementation. I do need a new set of updated x-rays.

## 2016-01-17 NOTE — Progress Notes (Signed)
   Subjective:    CC: Right knee pain  HPI: This is a pleasant 82 year old female, she comes in with a years long history of pain that she localizes along the medial joint line moderate, persistent without radiation, no mechanical symptoms, minimal swelling. Tries occasional Aleve without much improvement.  Past medical history, Surgical history, Family history not pertinant except as noted below, Social history, Allergies, and medications have been entered into the medical record, reviewed, and no changes needed.   Review of Systems: No fevers, chills, night sweats, weight loss, chest pain, or shortness of breath.   Objective:    General: Well Developed, well nourished, and in no acute distress.  Neuro: Alert and oriented x3, extra-ocular muscles intact, sensation grossly intact.  HEENT: Normocephalic, atraumatic, pupils equal round reactive to light, neck supple, no masses, no lymphadenopathy, thyroid nonpalpable.  Skin: Warm and dry, no rashes. Cardiac: Regular rate and rhythm, no murmurs rubs or gallops, no lower extremity edema.  Respiratory: Clear to auscultation bilaterally. Not using accessory muscles, speaking in full sentences. Right Knee: Minimal swelling: Tenderness to palpation at the medial joint line. ROM normal in flexion and extension and lower leg rotation. Ligaments with solid consistent endpoints including ACL, PCL, LCL, MCL. Negative Mcmurray's and provocative meniscal tests. Non painful patellar compression. Patellar and quadriceps tendons unremarkable. Hamstring and quadriceps strength is normal.  Procedure: Real-time Ultrasound Guided Injection of right knee Device: GE Logiq E  Verbal informed consent obtained.  Time-out conducted.  Noted no overlying erythema, induration, or other signs of local infection.  Skin prepped in a sterile fashion.  Local anesthesia: Topical Ethyl chloride.  With sterile technique and under real time ultrasound guidance:  1 mL  kenalog 40, 2 mL lidocaine, 2 mL Marcaine injected easily Completed without difficulty  Pain immediately resolved suggesting accurate placement of the medication.  Advised to call if fevers/chills, erythema, induration, drainage, or persistent bleeding.  Images permanently stored and available for review in the ultrasound unit.  Impression: Technically successful ultrasound guided injection.  Impression and Recommendations:

## 2016-01-30 ENCOUNTER — Emergency Department (INDEPENDENT_AMBULATORY_CARE_PROVIDER_SITE_OTHER)
Admission: EM | Admit: 2016-01-30 | Discharge: 2016-01-30 | Disposition: A | Discharge status: Home / Self Care | Payer: 59 | Source: Home / Self Care | Attending: Emergency Medicine | Admitting: Emergency Medicine

## 2016-01-30 ENCOUNTER — Encounter: Payer: Self-pay | Admitting: *Deleted

## 2016-01-30 DIAGNOSIS — R000 Tachycardia, unspecified: Secondary | ICD-10-CM

## 2016-01-30 DIAGNOSIS — R030 Elevated blood-pressure reading, without diagnosis of hypertension: Secondary | ICD-10-CM | POA: Diagnosis not present

## 2016-01-30 DIAGNOSIS — IMO0001 Reserved for inherently not codable concepts without codable children: Secondary | ICD-10-CM

## 2016-01-30 NOTE — ED Triage Notes (Signed)
Pt c/o elevated BP this afternoon . She reports BP 190/86 P-122 at 1600. She is unsure if she took her BP med today.

## 2016-01-30 NOTE — ED Provider Notes (Signed)
[facility]    CSN: [id number] Arrival date & time: 01/30/16  7829      History   Chief Complaint Chief Complaint  Patient presents with  . Hypertension    HPI Christina [name] is a 82 y.o. female.   HPI Son brings her in today. Patient has pleasant 82 year old female, who noted fast heart rate today at 4 PM and when she checked her blood pressure it was 190/86. Felt like heart was faster than usual and she felt somewhat fatigued, but denies chest pain or shortness of breath or dysphagia or syncope or lightheadedness.  She admits she is under some stress as brother in law passed away yesterday, and funeral is tomorrow. She has a history of hypertension and BPs frequently run in the 170/90 range. On lisinopril 10 mg daily, but she is not sure if she forgot to take her dose this morning. Past Medical History:  Diagnosis Date  . Hyperlipidemia   . Hypertension     Patient Active Problem List   Diagnosis Date Noted  . Primary osteoarthritis of both knees 01/17/2016  . Hypothyroid 06/07/2015  . Osteoporosis 04/17/2015  . Hyperparathyroidism (HCC) 02/07/2015  . Hypercalcemia 01/27/2015  . Essential hypertension, benign 04/28/2013  . Hyperlipidemia 04/28/2013    History reviewed. No pertinent surgical history.  OB History    No data available       Home Medications    Prior to Admission medications   Medication Sig Start Date End Date Taking? Authorizing Provider  alendronate (FOSAMAX) 70 MG tablet Take 1 tablet (70 mg total) by mouth every 7 (seven) days. Take with a full glass of water on an empty stomach. Repeat bone density scan Oct. 2017 04/17/15   Laren Boom, DO  aspirin 81 MG tablet Take 81 mg by mouth daily. Two tablets daily    Historical Provider, MD  cholecalciferol (VITAMIN D) 1000 units tablet Take 1,000 Units by mouth daily.    Historical Provider, MD  levothyroxine (SYNTHROID, LEVOTHROID) 50 MCG tablet Take 1 tablet (50 mcg total) by mouth  daily. 06/07/15   Sean Hommel, DO  lisinopril (PRINIVIL,ZESTRIL) 10 MG tablet Take 1 tablet (10 mg total) by mouth daily. 01/17/16   Monica Becton, MD    Family History History reviewed. No pertinent family history.  Social History Social History  Substance Use Topics  . Smoking status: Never Smoker  . Smokeless tobacco: Never Used  . Alcohol use No     Allergies   Sulfur   Review of Systems Review of Systems  Constitutional: Negative for chills and fever.  HENT: Negative for congestion.   Respiratory: Negative for cough, chest tightness and shortness of breath.   Cardiovascular: Negative for chest pain, palpitations and leg swelling.  Gastrointestinal: Negative for abdominal pain.  Skin: Negative for rash.  Neurological: Negative for syncope.  Psychiatric/Behavioral: Negative for agitation and confusion. The patient is nervous/anxious (Mild anxiety, she admits related to her brother-in-law's death yesterday).      Physical Exam Triage Vital Signs ED Triage Vitals  Enc Vitals Group     BP      Pulse      Resp      Temp      Temp src      SpO2      Weight      Height      Head Circumference      Peak Flow      Pain Score  Pain Loc      Pain Edu?      Excl. in GC?    No data found.   Updated Vital Signs BP 195/78 (BP Location: Left Arm)   Pulse 117   Temp 98.3 F (36.8 C) (Oral)   Resp 16   Ht  (1.626 m)   Wt 143 lb (64.9 kg)   SpO2 96%   BMI 24.55 kg/m   Visual Acuity Right Eye Distance:   Left Eye Distance:   Bilateral Distance:    Right Eye Near:   Left Eye Near:    Bilateral Near:     Physical Exam  Constitutional: She is oriented to person, place, and time. She appears well-developed and well-nourished. No distress.  HENT:  Head: Normocephalic and atraumatic.  Mouth/Throat: Oropharynx is clear and moist.  Eyes: EOM are normal. Pupils are equal, round, and reactive to light.  Neck: Normal range of motion. Neck supple.  No JVD present.  Cardiovascular: Regular rhythm and normal heart sounds.  Exam reveals no gallop and no friction rub.   No murmur heard. Tachycardic  Pulmonary/Chest: Effort normal and breath sounds normal. She has no wheezes. She has no rales.  Abdominal: Soft. She exhibits no distension. There is no tenderness.  Neurological: She is alert and oriented to person, place, and time. No cranial nerve deficit.  Skin: Skin is warm and dry. No rash noted.  Psychiatric: She has a normal mood and affect. Thought content normal.  Nursing note and vitals reviewed.    UC Treatments / Results  Labs (all labs ordered are listed, but only abnormal results are displayed) Labs Reviewed - No data to display  EKG  EKG Interpretation None     EKG: Sinus tachycardia. Rate 107. No ST or T-wave abnormalities. Other than sinus tachycardia, no acute abnormalities and no sign of ischemic abnormalities  Radiology No results found.  Procedures Procedures (including critical care time)  Medications Ordered in UC Medications - No data to display   Initial Impression / Assessment and Plan / UC Course  I have reviewed the triage vital signs and the nursing notes.  Pertinent labs & imaging results that were available during my care of the patient were reviewed by me and considered in my medical decision making (see chart for details).  Clinical Course  Comment By Time  Exam within normal limits except heart is tachycardic, regular rhythm. EKG ordered Lajean Manes, MD 07/31 1924   EKG shows sinus tachycardia, rate 107. No other significant abnormality seen.   Final Clinical Impressions(s) / UC Diagnoses   Final diagnoses:  Sinus tachycardia (HCC)  Elevated blood pressure  No evidence for any acute heart event.  New Prescriptions New Prescriptions   No medications on file  Advised to take an extra lisinopril 10 mg po now. Patient and son voiced understanding  Follow-up with PCP 1-2 days to  recheck BP. Precautions discussed. Red flags discussed.--Emergency room if any red flag or new or worsening symptoms Questions invited and answered. Patient and son voiced understanding and agreement.    Lajean Manes, MD 01/30/16 2153

## 2016-02-01 ENCOUNTER — Telehealth: Payer: Self-pay | Admitting: Emergency Medicine

## 2016-02-01 NOTE — Telephone Encounter (Signed)
Feeling better, pulse is back to normal.

## 2016-02-14 ENCOUNTER — Ambulatory Visit: Payer: 59 | Admitting: Sports Medicine

## 2016-03-19 ENCOUNTER — Telehealth: Payer: Self-pay | Admitting: Osteopathic Medicine

## 2016-03-19 NOTE — Telephone Encounter (Signed)
Bone density tests are recommended to be done every 2 years - she is not due for this since her last one was 04/2015

## 2016-03-19 NOTE — Telephone Encounter (Signed)
Pt would like to get a bone density scan before her physical she has scheduled with you on 9/28. Thanks

## 2016-03-29 ENCOUNTER — Encounter: Payer: Self-pay | Admitting: Osteopathic Medicine

## 2016-03-29 ENCOUNTER — Ambulatory Visit (INDEPENDENT_AMBULATORY_CARE_PROVIDER_SITE_OTHER): Payer: 59 | Admitting: Osteopathic Medicine

## 2016-03-29 VITALS — BP 168/52 | HR 92 | Temp 97.7°F | Ht 64.0 in | Wt 145.0 lb

## 2016-03-29 DIAGNOSIS — M810 Age-related osteoporosis without current pathological fracture: Secondary | ICD-10-CM

## 2016-03-29 DIAGNOSIS — E213 Hyperparathyroidism, unspecified: Secondary | ICD-10-CM | POA: Diagnosis not present

## 2016-03-29 DIAGNOSIS — Z23 Encounter for immunization: Secondary | ICD-10-CM | POA: Diagnosis not present

## 2016-03-29 DIAGNOSIS — Z0000 Encounter for general adult medical examination without abnormal findings: Secondary | ICD-10-CM | POA: Diagnosis not present

## 2016-03-29 DIAGNOSIS — M170 Bilateral primary osteoarthritis of knee: Secondary | ICD-10-CM

## 2016-03-29 NOTE — Patient Instructions (Addendum)
   Let me know if you have any questions about advanced directives!   Try drinking plenty of water, exercise, lots of fiber to help constipation.   We will get labs today. We will plan for bone density test in October.   Bring your home blood pressure cuff to all your doctor visits.

## 2016-03-29 NOTE — Progress Notes (Signed)
HPI: Christina [name] is a 82 y.o. female  who presents to [facility] [place] today, 03/29/16,  for chief complaint of:  Chief Complaint  Patient presents with  . Annual Exam    Patient is to establish care with me, previously followed by Dr. Ivan Anchors. No major complaints today other than leg cramping, see below. No need for medication refills at this moment.  Leg cramps - bothers her from time to time, Not necessarily getting any worse lately.  Knee pain - bilateral knee pain, previously following the sports medicine for this, injection helps somewhat, she has not followed back with Dr. Karie Schwalbe  History of elevated calcium, was following with endocrinology for primary hyperparathyroidism. Reviewed recent note from Dr. Shawnee Knapp, 12/08/2015,  Primary Hyperparathyroidism: Corrected calcium 9.6 (serum calcium 10.0 with albumin 4.5) on 12/05/15 labs. Adonna's calcium levels have remained stable with a bisphosphonate therapy. She can have calcium levels and renal function checked every 6-12 months for monitoring.   Past medical, surgical, social and family history reviewed: Past Medical History:  Diagnosis Date  . Hyperlipidemia   . Hypertension    History reviewed. No pertinent surgical history. Social History  Substance Use Topics  . Smoking status: Never Smoker  . Smokeless tobacco: Never Used  . Alcohol use No   History reviewed. No pertinent family history.   Current medication list and allergy/intolerance information reviewed:   Current Outpatient Prescriptions  Medication Sig Dispense Refill  . alendronate (FOSAMAX) 70 MG tablet Take 1 tablet (70 mg total) by mouth every 7 (seven) days. Take with a full glass of water on an empty stomach. Repeat bone density scan Oct. 2017 4 tablet 11  . aspirin 81 MG tablet Take 81 mg by mouth daily. Two tablets daily    . cholecalciferol (VITAMIN D) 1000 units tablet Take 1,000 Units by mouth daily.    Marland Kitchen levothyroxine (SYNTHROID,  LEVOTHROID) 50 MCG tablet Take 1 tablet (50 mcg total) by mouth daily. 90 tablet 3  . lisinopril (PRINIVIL,ZESTRIL) 10 MG tablet Take 1 tablet (10 mg total) by mouth daily. 90 tablet 1  . naproxen sodium (ANAPROX) 220 MG tablet Take 220 mg by mouth.     No current facility-administered medications for this visit.    Allergies  Allergen Reactions  . Sulfur Hives      Review of Systems:  Constitutional:  No  fever, no chills, No recent illness  HEENT: No  headache, no vision change, no hearing change  Cardiac: No  chest pain, No  pressure  Respiratory:  No  shortness of breath. No  Cough  Gastrointestinal: No  abdominal pain, No  nausea, No  vomiting,  No  blood in stool, No  diarrhea, +constipation   Musculoskeletal: No new myalgia/arthralgia, knee pain bothersome  Skin: No  Rash,  Endocrine: No cold intolerance,  No heat intolerance. No polyuria/polydipsia/polyphagia   Neurologic: No  weakness, No  dizziness  Psychiatric: No  concerns with depression, No  concerns with anxiety  Exam:  BP (!) 168/52   Pulse 92   Temp 97.7 F (36.5 C) (Oral)   Ht 5\' 4"  (1.626 m)   Wt 145 lb (65.8 kg)   BMI 24.89 kg/m   Constitutional: VS see above. General Appearance: alert, well-developed, well-nourished, NAD  Eyes: Normal lids and conjunctive, non-icteric sclera  Ears, Nose, Mouth, Throat: MMM, Normal external inspection ears/nares/mouth/lips/gums.   Neck: No masses, trachea midline. No thyroid enlargement.   Respiratory: Normal respiratory effort. no wheeze,  no rhonchi, no rales  Cardiovascular: S1/S2 normal, no murmur, no rub/gallop auscultated. RRR. No lower extremity edema.  Gastrointestinal: Nontender, no masses.  Musculoskeletal: Gait normal.   Neurological: Normal balance/coordination. No tremor.   Skin: warm, dry, intact. No rash/ulcer.     Psychiatric: Normal judgment/insight. Normal mood and affect. Oriented x3.     ASSESSMENT/PLAN:   Annual physical  exam - Plan: CBC with Differential/Platelet, TSH, VITAMIN D 25 Hydroxy (Vit-D Deficiency, Fractures), Lipid panel, Comprehensive metabolic panel, CMP and Liver  Osteoporosis - Plan: VITAMIN D 25 Hydroxy (Vit-D Deficiency, Fractures), DG Bone Density  Need for prophylactic vaccination and inoculation against influenza - Plan: Flu Vaccine QUAD 36+ mos IM  Hyperparathyroidism (HCC)  Primary osteoarthritis of both knees - Patient advised to follow-up with Dr. Karie Schwalbe    FEMALE PREVENTIVE CARE  ANNUAL SCREENING/COUNSELING Tobacco - noNever  Alcohol - none Diet/Exercise - HEALTHY HABITS DISCUSSED TO DECREASE CV RISK - knees give her problems  Depression - PQH2 Negative Domestic violence concerns - no HTN SCREENING - SEE VITALS Vaccination status - SEE BELOW  SEXUAL HEALTH Sexually active in the past year - no  INFECTIOUS DISEASE SCREENING HIV - all adults 15-65 - does not need GC/CT - sexually active - does not need HepC - DOB [birth year] - does not need TB - does not need  DISEASE SCREENING Lipid - needs DM2 - needs Osteoporosis - needs  CANCER SCREENING Cervical - does not need Breast - does not need Lung - does not need Colon - does not need  ADULT VACCINATION Influenza - was given Td - was offered and declined by the patient HPV - was not indicated Zoster - was offered and declined by the patient Pneumonia - already has  OTHER Fall - exercise and Vit D age 48+ - needs Consider ASA - age 80-59 - does not need       Visit summary with medication list and pertinent instructions was printed for patient to review. All questions at time of visit were answered - patient instructed to contact office with any additional concerns. ER/RTC precautions were reviewed with the patient. Follow-up plan: Return in about 6 months (around 09/26/2016) for FOLLOW-UP BLOOD PRESSURE.

## 2016-03-29 NOTE — Assessment & Plan Note (Signed)
FEMALE PREVENTIVE CARE  ANNUAL SCREENING/COUNSELING Tobacco - noNever  Alcohol - none Diet/Exercise - HEALTHY HABITS DISCUSSED TO DECREASE CV RISK - knees give her problems  Depression - PQH2 Negative Domestic violence concerns - no HTN SCREENING - SEE VITALS Vaccination status - SEE BELOW  SEXUAL HEALTH Sexually active in the past year - no  INFECTIOUS DISEASE SCREENING HIV - all adults 15-65 - does not need GC/CT - sexually active - does not need HepC - DOB [birth year] - does not need TB - does not need  DISEASE SCREENING Lipid - needs DM2 - needs Osteoporosis - needs  CANCER SCREENING Cervical - does not need Breast - does not need Lung - does not need Colon - does not need  ADULT VACCINATION Influenza - was given Td - was offered and declined by the patient HPV - was not indicated Zoster - was offered and declined by the patient Pneumonia - already has  OTHER Fall - exercise and Vit D age 80+ - needs Consider ASA - age 80-59 - does not need

## 2016-03-30 LAB — CBC AND DIFFERENTIAL
HEMOGLOBIN: 12.7 g/dL (ref 12.0–16.0)
Platelets: 219 10*3/uL (ref 150–399)
WBC: 4.5 10^3/mL

## 2016-03-30 LAB — HEPATIC FUNCTION PANEL
ALT: 10 U/L (ref 7–35)
AST: 14 U/L (ref 13–35)

## 2016-03-30 LAB — LIPID PANEL
Cholesterol: 219 mg/dL — AB (ref 0–200)
HDL: 53 mg/dL (ref 35–70)
LDL CALC: 139 mg/dL
Triglycerides: 136 mg/dL (ref 40–160)

## 2016-03-30 LAB — VITAMIN D 25 HYDROXY (VIT D DEFICIENCY, FRACTURES): Vit D, 25-Hydroxy: 29.4

## 2016-03-30 LAB — BASIC METABOLIC PANEL
Creatinine: 0.8 mg/dL (ref 0.5–1.1)
Glucose: 86 mg/dL

## 2016-03-30 LAB — TSH: TSH: 1.7 u[IU]/mL (ref 0.41–5.90)

## 2016-04-04 ENCOUNTER — Telehealth: Payer: Self-pay | Admitting: Osteopathic Medicine

## 2016-04-04 NOTE — Telephone Encounter (Signed)
Please call patient: I reviewed labs. Everything looks good. Sugars, kidney function, thyroid, and blood counts are all good. Vitamin D was a bit low, would recommend supplementation with 1000 units daily. Calcium was good. Cholesterol was borderline elevated, would recommend reduce fat/high fiber diet. Plan to recheck cholesterol and calcium in 6 months, be sure she keeps her follow-up appointment.

## 2016-04-05 ENCOUNTER — Encounter: Payer: Self-pay | Admitting: Osteopathic Medicine

## 2016-04-05 NOTE — Telephone Encounter (Signed)
SPOKE TO PATIENT GAVE HER RESULTS AS NOTED BELOW. Vallorie Niccoli,CMA  

## 2016-07-17 ENCOUNTER — Other Ambulatory Visit: Payer: Self-pay

## 2016-07-17 MED ORDER — LISINOPRIL 10 MG PO TABS: 10.0000 mg | ORAL_TABLET | Freq: Every day | ORAL | 0 refills | Status: DC

## 2016-07-17 MED ORDER — LEVOTHYROXINE SODIUM 50 MCG PO TABS: 50.0000 ug | ORAL_TABLET | Freq: Every day | ORAL | 0 refills | Status: DC

## 2016-07-17 NOTE — Telephone Encounter (Signed)
Patient request refill for Lisinopril 10 mg and Levothyroxine 50 mcg. Patient advised to follow up for further refills. Rhonda Cunningham,CMA

## 2016-09-17 ENCOUNTER — Ambulatory Visit (INDEPENDENT_AMBULATORY_CARE_PROVIDER_SITE_OTHER): Payer: 59 | Admitting: Osteopathic Medicine

## 2016-09-17 ENCOUNTER — Encounter: Payer: Self-pay | Admitting: Osteopathic Medicine

## 2016-09-17 VITALS — BP 155/53 | HR 96 | Ht 64.0 in | Wt 144.0 lb

## 2016-09-17 DIAGNOSIS — I10 Essential (primary) hypertension: Secondary | ICD-10-CM

## 2016-09-17 DIAGNOSIS — E039 Hypothyroidism, unspecified: Secondary | ICD-10-CM | POA: Diagnosis not present

## 2016-09-17 DIAGNOSIS — E785 Hyperlipidemia, unspecified: Secondary | ICD-10-CM

## 2016-09-17 DIAGNOSIS — K5900 Constipation, unspecified: Secondary | ICD-10-CM | POA: Diagnosis not present

## 2016-09-17 DIAGNOSIS — E213 Hyperparathyroidism, unspecified: Secondary | ICD-10-CM

## 2016-09-17 DIAGNOSIS — M810 Age-related osteoporosis without current pathological fracture: Secondary | ICD-10-CM | POA: Diagnosis not present

## 2016-09-17 DIAGNOSIS — L989 Disorder of the skin and subcutaneous tissue, unspecified: Secondary | ICD-10-CM

## 2016-09-17 MED ORDER — LISINOPRIL 10 MG PO TABS: 10.0000 mg | ORAL_TABLET | Freq: Every day | ORAL | Status: DC

## 2016-09-17 MED ORDER — LEVOTHYROXINE SODIUM 50 MCG PO TABS: 50.0000 ug | ORAL_TABLET | Freq: Every day | ORAL | 3 refills | Status: DC

## 2016-09-17 MED ORDER — BISACODYL 5 MG PO TBEC: 5.0000 mg | DELAYED_RELEASE_TABLET | Freq: Every day | ORAL | 1 refills | Status: DC | PRN

## 2016-09-17 NOTE — Patient Instructions (Signed)

## 2016-09-17 NOTE — Progress Notes (Signed)
HPI: Christina [name] is a 83 y.o. female  who presents to [facility] [place] today, 09/17/16,  for chief complaint of:  Chief Complaint  Patient presents with  . Follow-up    BLOOD PRESSURE    HTN: SBP elevated today but DBP okay. No chest pain/pressure. Home BP cuff with her today. 162/67 on home cuff and our 155/70  Vitamin D deficiency and Hx osteoporosis, was taking Fosamax but stopped this in 04/2016 due to leg cramps. And no leg cramps since 06/2016. Positive bone density study 04/21/2015.  Hypothyroid: due for refill, will recheck labs today   Arthritis: Aleve uses sometimes. Previously seem by Dr. Karie Schwalbe and injections were minimally helpful. Hyaluronic acid has seemed to help.   Skin concern: Growth on face to the left of her nose, feels a bit rough, patient not sure if this is getting better or not. Nonpainful, not ulcerating/draining  Constipation: Patient has some questions about over-the-counter medications, constipation has been ongoing problem for her for several months. No bloody or black stool.  History of elevated calcium, was following with endocrinology for primary hyperparathyroidism. Reviewed recent note from Dr. Shawnee KnappLevy, 12/08/2015,  Primary Hyperparathyroidism: Corrected calcium 9.6 (serum calcium 10.0 with albumin 4.5) on 12/05/15 labs. Hudson's calcium levels have remained stable with a bisphosphonate therapy. She can have calcium levels and renal function checked every 6-12 months for monitoring.   Past medical history, surgical history, social history and family history reviewed.  Patient Active Problem List   Diagnosis Date Noted  . Annual physical exam 03/29/2016  . Primary osteoarthritis of both knees 01/17/2016  . Hypothyroid 06/07/2015  . Osteoporosis 04/17/2015  . Hyperparathyroidism (HCC) 02/07/2015  . Hypercalcemia 01/27/2015  . Essential hypertension, benign 04/28/2013  . Hyperlipidemia 04/28/2013    Current medication list and  allergy/intolerance information reviewed.   Current Outpatient Prescriptions on File Prior to Visit  Medication Sig Dispense Refill  . alendronate (FOSAMAX) 70 MG tablet Take 1 tablet (70 mg total) by mouth every 7 (seven) days. Take with a full glass of water on an empty stomach. Repeat bone density scan Oct. 2017 4 tablet 11  . aspirin 81 MG tablet Take 81 mg by mouth daily. Two tablets daily    . cholecalciferol (VITAMIN D) 1000 units tablet Take 1,000 Units by mouth daily.    Marland Kitchen. levothyroxine (SYNTHROID, LEVOTHROID) 50 MCG tablet Take 1 tablet (50 mcg total) by mouth daily. APPOINTMENT NEEDED FOR FURTHER REFILLS 60 tablet 0  . lisinopril (PRINIVIL,ZESTRIL) 10 MG tablet Take 1 tablet (10 mg total) by mouth daily. APPOINTMENT NEEDED FOR FURTHER REFILLS 60 tablet 0  . naproxen sodium (ANAPROX) 220 MG tablet Take 220 mg by mouth.     No current facility-administered medications on file prior to visit.    Allergies  Allergen Reactions  . Sulfur Hives      Review of Systems:  Constitutional: No recent illness  HEENT: No  headache, no vision change  Cardiac: No  chest pain, No  pressure, No palpitations  Respiratory:  No  shortness of breath. No  Cough  Gastrointestinal: No  abdominal pain, no change on bowel habits  Musculoskeletal: No new myalgia/arthralgia, +chronic knee pain, +occasional leg cramps  Skin: No  Rash  Neurologic: No  weakness, No  Dizziness  Psychiatric: No  concerns with depression, No  concerns with anxiety  Exam:  BP (!) 155/53   Pulse 96   Ht 5\' 4"  (1.626 m)   Wt 144 lb (65.3  kg)   BMI 24.72 kg/m   Constitutional: VS see above. General Appearance: alert, well-developed, well-nourished, NAD  Eyes: Normal lids and conjunctive, non-icteric sclera  Ears, Nose, Mouth, Throat: MMM, Normal external inspection ears/nares/mouth/lips/gums.  Neck: No masses, trachea midline.   Respiratory: Normal respiratory effort. no wheeze, no rhonchi, no  rales  Cardiovascular: S1/S2 normal, no murmur, no rub/gallop auscultated. RRR. No LE edema   Musculoskeletal: Gait normal. Symmetric and independent movement of all extremities  Neurological: Normal balance/coordination. No tremor.  Skin: warm, dry, intact.   Psychiatric: Normal judgment/insight. Normal mood and affect. Oriented x3.      ASSESSMENT/PLAN:   Osteoporosis, unspecified osteoporosis type, unspecified pathological fracture presence - Discussed alternative treatments, patient would like to stay off of medications for now - Plan: COMPLETE METABOLIC PANEL WITH GFR, TSH, VITAMIN D 25 Hydroxy (Vit-D Deficiency, Fractures), PTH, Intact and Calcium, Comprehensive metabolic panel  Hyperparathyroidism (HCC) - Plan: COMPLETE METABOLIC PANEL WITH GFR, PTH, Intact and Calcium, Comprehensive metabolic panel, CANCELED: PTH, Intact and Calcium  Hypercalcemia - Plan: VITAMIN D 25 Hydroxy (Vit-D Deficiency, Fractures), PTH, Intact and Calcium, Comprehensive metabolic panel, CANCELED: PTH, Intact and Calcium  Hyperlipidemia, unspecified hyperlipidemia type - Plan: Lipid panel, CANCELED: Lipid panel  Essential hypertension - Home blood pressure cuff within 10 mmHg of office equipment, patient to bring logs to next visit. Systolic blood pressure on the low side, keep current meds - Plan: lisinopril (PRINIVIL,ZESTRIL) 10 MG tablet  Hypothyroidism, unspecified type - Plan: levothyroxine (SYNTHROID, LEVOTHROID) 50 MCG tablet, TSH, CANCELED: TSH  Skin lesion - Did not appear malignant but could not rule out neoplasm such as basal cell or verrucous lesion without biopsy, will send to dermatology for second opinion - Plan: Ambulatory referral to Dermatology  Constipation, unspecified constipation type - Advised increased fiber intake, can trial daily stool softener, exercise/hydration. Follow-up on this in 1 month  Essential hypertension, benign     Follow-up plan: Return in about 4 weeks  (around 10/15/2016) for recheck constipation and discuss osteoporosis, review lab results.  Visit summary with medication list and pertinent instructions was printed for patient to review, alert [place] if any changes needed. All questions at time of visit were answered - patient instructed to contact office with any additional concerns. ER/RTC precautions were reviewed with the patient and understanding verbalized.   Note: Total time spent 40 minutes, greater than 50% of the visit was spent face-to-face counseling and coordinating care for the following: The primary encounter diagnosis was Osteoporosis, unspecified osteoporosis type, unspecified pathological fracture presence. Diagnoses of Hyperparathyroidism (HCC), Hypercalcemia, Hyperlipidemia, unspecified hyperlipidemia type, Essential hypertension, Hypothyroidism, unspecified type, Skin lesion, Constipation, unspecified constipation type, and Essential hypertension, benign were also pertinent to this visit.Marland Kitchen

## 2016-09-18 ENCOUNTER — Encounter: Payer: Self-pay | Admitting: Osteopathic Medicine

## 2016-09-18 LAB — BASIC METABOLIC PANEL
BUN: 17 mg/dL (ref 4–21)
CREATININE: 0.7 mg/dL (ref 0.5–1.1)
Glucose: 88 mg/dL
POTASSIUM: 4.6 mmol/L (ref 3.4–5.3)
Sodium: 142 mmol/L (ref 137–147)

## 2016-09-18 LAB — VITAMIN D 25 HYDROXY (VIT D DEFICIENCY, FRACTURES): Vit D, 25-Hydroxy: 42.4

## 2016-09-18 LAB — TSH: TSH: 1.32 u[IU]/mL (ref 0.41–5.90)

## 2016-09-18 LAB — HEPATIC FUNCTION PANEL
ALT: 10 U/L (ref 7–35)
AST: 14 U/L (ref 13–35)

## 2016-09-19 ENCOUNTER — Telehealth: Payer: Self-pay | Admitting: Osteopathic Medicine

## 2016-09-19 LAB — LIPID PANEL
CHOLESTEROL: 207 mg/dL — AB (ref 0–200)
HDL: 58 mg/dL (ref 35–70)
LDL Cholesterol: 123 mg/dL
TRIGLYCERIDES: 130 mg/dL (ref 40–160)

## 2016-09-19 NOTE — Telephone Encounter (Signed)
Please call patient: Calcium levels were fine, no other concerns on labs. Cholesterol is looking a bit better than it was in September.   Lab results placed in your in basket for abstraction, thakns

## 2016-09-19 NOTE — Telephone Encounter (Signed)
SPOKE TO PATIENT GAVE HER RESULTS AS NOTED BELOW. Christina [name],CMA  

## 2016-09-20 ENCOUNTER — Encounter: Payer: Self-pay | Admitting: Osteopathic Medicine

## 2016-09-20 LAB — CALCIUM
ALBUMIN SERUM: 4.6
Calcium, Ser: 10.4
PTH INTERP: 59

## 2017-01-24 ENCOUNTER — Emergency Department (INDEPENDENT_AMBULATORY_CARE_PROVIDER_SITE_OTHER)
Admission: EM | Admit: 2017-01-24 | Discharge: 2017-01-24 | Disposition: A | Discharge status: Home / Self Care | Payer: 59 | Source: Home / Self Care | Attending: Family Medicine | Admitting: Family Medicine

## 2017-01-24 ENCOUNTER — Other Ambulatory Visit: Payer: Self-pay

## 2017-01-24 DIAGNOSIS — R002 Palpitations: Secondary | ICD-10-CM

## 2017-01-24 LAB — POCT URINALYSIS DIP (MANUAL ENTRY)
BILIRUBIN UA: NEGATIVE
BILIRUBIN UA: NEGATIVE mg/dL
Glucose, UA: NEGATIVE mg/dL
LEUKOCYTES UA: NEGATIVE
Nitrite, UA: NEGATIVE
PH UA: 6 (ref 5.0–8.0)
Protein Ur, POC: NEGATIVE mg/dL
SPEC GRAV UA: 1.01 (ref 1.010–1.025)
Urobilinogen, UA: 0.2 E.U./dL

## 2017-01-24 NOTE — Discharge Instructions (Signed)
Monitor blood pressure and pulse more frequently at different times of day and record on a calendar. If symptoms become significantly worse during the night or over the weekend, proceed to the local emergency room.

## 2017-01-24 NOTE — ED Triage Notes (Signed)
Pt had 3 "spells" today, the first around 3 pm, the 2nd around 5 pm, and the 3rd around 6:30 where she felt her heart was racing and felt dizzy.  She stated that she did not pass out, but felt close.

## 2017-01-24 NOTE — ED Provider Notes (Signed)
[facility]    CSN: [id number] Arrival date & time: 01/24/17  [id number]     History   Chief Complaint Chief Complaint  Patient presents with  . Tachycardia    HPI Christina [name] is a 83 y.o. female.   Patient reports that she had 3 "spells" today consisting of rapid heart rate and feeling dizzy.  They occurred at 3pm, 5pm, and about 6:30pm, each resolving spontaneously.  She felt dizzy during each occurrence but did not lose consciousness, and she did not experience chest pain or shortness of breath.  She has had similar episodes in the past.  She admits that she has felt anxious today.      Past Medical History:  Diagnosis Date  . Hyperlipidemia   . Hypertension     Patient Active Problem List   Diagnosis Date Noted  . Constipation 09/17/2016  . Skin lesion 09/17/2016  . Annual physical exam 03/29/2016  . Primary osteoarthritis of both knees 01/17/2016  . Hypothyroid 06/07/2015  . Osteoporosis 04/17/2015  . Hyperparathyroidism (HCC) 02/07/2015  . Hypercalcemia 01/27/2015  . Essential hypertension, benign 04/28/2013  . Hyperlipidemia 04/28/2013    History reviewed. No pertinent surgical history.  OB History    No data available       Home Medications    Prior to Admission medications   Medication Sig Start Date End Date Taking? Authorizing Provider  alendronate (FOSAMAX) 70 MG tablet Take 1 tablet (70 mg total) by mouth every 7 (seven) days. Take with a full glass of water on an empty stomach. Repeat bone density scan Oct. 2017 04/17/15   Laren BoomHommel, Sean, DO  aspirin 81 MG tablet Take 81 mg by mouth daily. Two tablets daily    [provider]  bisacodyl (DULCOLAX) 5 MG EC tablet Take 1 tablet (5 mg total) by mouth daily as needed for moderate constipation. 09/17/16   Sunnie NielsenAlexander, Natalie, DO  cholecalciferol (VITAMIN D) 1000 units tablet Take 1,000 Units by mouth daily.    [provider]  levothyroxine (SYNTHROID, LEVOTHROID) 50 MCG  tablet Take 1 tablet (50 mcg total) by mouth daily. 09/17/16   Sunnie NielsenAlexander, Natalie, DO  lisinopril (PRINIVIL,ZESTRIL) 10 MG tablet Take 1 tablet (10 mg total) by mouth daily. 09/17/16   Sunnie NielsenAlexander, Natalie, DO  naproxen sodium (ANAPROX) 220 MG tablet Take 220 mg by mouth.    [provider]    Family History History reviewed. No pertinent family history.  Social History Social History  Substance Use Topics  . Smoking status: Never Smoker  . Smokeless tobacco: Never Used  . Alcohol use No     Allergies   Sulfur   Review of Systems Review of Systems  Constitutional: Negative for activity change, appetite change, chills, diaphoresis, fatigue and fever.  HENT: Negative.   Eyes: Negative.   Respiratory: Negative.   Cardiovascular: Positive for palpitations. Negative for chest pain and leg swelling.  Gastrointestinal: Negative.   Genitourinary: Negative.   Musculoskeletal: Negative.   Skin: Negative.   Neurological: Positive for dizziness and light-headedness. Negative for tremors, seizures, syncope, facial asymmetry, speech difficulty, weakness, numbness and headaches.     Physical Exam Triage Vital Signs ED Triage Vitals [01/24/17 1914]  Enc Vitals Group     BP (!) 192/96     Pulse Rate (!) 127     Resp      Temp      Temp src      SpO2 98 %     Weight  Height      Head Circumference      Peak Flow      Pain Score      Pain Loc      Pain Edu?      Excl. in GC?    Orthostatic VS for the past 24 hrs:  BP- Lying Pulse- Lying BP- Sitting Pulse- Sitting BP- Standing at 0 minutes Pulse- Standing at 0 minutes  01/24/17 1917 169/70 112 168/80 115 163/77 122    Updated Vital Signs BP (!) 192/96 (BP Location: Left Arm)   Pulse (!) 127   SpO2 98%   Visual Acuity Right Eye Distance:   Left Eye Distance:   Bilateral Distance:    Right Eye Near:   Left Eye Near:    Bilateral Near:     Physical Exam Nursing notes and Vital Signs reviewed. Appearance:   Patient appears stated age, and in no acute distress.  She is alert and oriented.  Eyes:  Pupils are equal, round, and reactive to light and accomodation.  Extraocular movement is intact.  Conjunctivae are not inflamed  Ears:  Canals normal.  Tympanic membranes normal.  Nose:   Normal  Pharynx:  Normal Neck:  Supple.  No adenopathy or thyromegaly.  Lungs:  Clear to auscultation.  Breath sounds are equal.  Moving air well. Heart:  Regular rate and rhythm without murmurs, rubs, or gallops.  Rate 108 Abdomen:  Nontender without masses or hepatosplenomegaly.  Bowel sounds are present.  No CVA or flank tenderness.  Extremities:  No edema.  Skin:  No rash present.    UC Treatments / Results  Labs (all labs ordered are listed, but only abnormal results are displayed) Labs Reviewed  POCT URINALYSIS DIP (MANUAL ENTRY) - Abnormal; Notable for the following:       Result Value   Blood, UA trace-lysed (*)    All other components within normal limits POCT CBC:  WBC 6.2; LY 32.6; MO 3.5; GR 63.9; Hgb 13.0; Platelets 253     EKG  EKG Interpretation  Rate:  116 BPM PR:  164 msec QT:  296 msec QTcH:  411 msec QRSD:  78 msec QRS axis:  -9 degrees Interpretation:   Sinus tachycardia, otherwise normal        Radiology No results found.  Procedures Procedures (including critical care time)  Medications Ordered in UC Medications - No data to display   Initial Impression / Assessment and Plan / UC Course  I have reviewed the triage vital signs and the nursing notes.  Pertinent labs & imaging results that were available during my care of the patient were reviewed by me and considered in my medical decision making (see chart for details).    EKG unremarkable except for rate 116; on exam rate 108. Normal CBC reassuring.  On September 18, 2016, TSH was normal at 1.32.  No evidence UTI. Note elevated BP, improved on repeat exam. Suspect anxiety. Monitor blood pressure and pulse more  frequently at different times of day and record on a calendar. If symptoms become significantly worse during the night or over the weekend, proceed to the local emergency room. Followup with Family Doctor    Final Clinical Impressions(s) / UC Diagnoses   Final diagnoses:  Palpitations    New Prescriptions New Prescriptions   No medications on file     Lattie HawBeese, Bertice Risse A, MD 02/01/17 [id number]

## 2017-01-26 ENCOUNTER — Telehealth: Payer: Self-pay | Admitting: Emergency Medicine

## 2017-01-26 NOTE — Telephone Encounter (Signed)
Spoke with patient states that she is feeling better, not having anymore palpatations, some lightheadness but planning on following up with her PCP as instructed.  TMartin,CMA

## 2017-02-04 ENCOUNTER — Telehealth (INDEPENDENT_AMBULATORY_CARE_PROVIDER_SITE_OTHER): Payer: 59

## 2017-02-04 DIAGNOSIS — R002 Palpitations: Secondary | ICD-10-CM

## 2017-02-04 LAB — POCT CBC W AUTO DIFF (K'VILLE URGENT CARE)

## 2017-02-04 NOTE — Telephone Encounter (Signed)
Encounter opened for CBC order

## 2017-09-26 ENCOUNTER — Other Ambulatory Visit: Payer: Self-pay | Admitting: Osteopathic Medicine

## 2017-09-26 DIAGNOSIS — I10 Essential (primary) hypertension: Secondary | ICD-10-CM

## 2017-10-07 ENCOUNTER — Encounter: Payer: Self-pay | Admitting: Osteopathic Medicine

## 2017-10-07 ENCOUNTER — Other Ambulatory Visit: Payer: Self-pay

## 2017-10-07 ENCOUNTER — Ambulatory Visit (INDEPENDENT_AMBULATORY_CARE_PROVIDER_SITE_OTHER): Payer: 59 | Admitting: Osteopathic Medicine

## 2017-10-07 VITALS — BP 167/72 | HR 96 | Temp 98.1°F | Wt 144.1 lb

## 2017-10-07 DIAGNOSIS — Z0000 Encounter for general adult medical examination without abnormal findings: Secondary | ICD-10-CM

## 2017-10-07 DIAGNOSIS — E213 Hyperparathyroidism, unspecified: Secondary | ICD-10-CM | POA: Diagnosis not present

## 2017-10-07 DIAGNOSIS — K5900 Constipation, unspecified: Secondary | ICD-10-CM | POA: Diagnosis not present

## 2017-10-07 DIAGNOSIS — Z7189 Other specified counseling: Secondary | ICD-10-CM

## 2017-10-07 DIAGNOSIS — E785 Hyperlipidemia, unspecified: Secondary | ICD-10-CM

## 2017-10-07 DIAGNOSIS — Z0001 Encounter for general adult medical examination with abnormal findings: Secondary | ICD-10-CM | POA: Diagnosis not present

## 2017-10-07 DIAGNOSIS — I10 Essential (primary) hypertension: Secondary | ICD-10-CM

## 2017-10-07 DIAGNOSIS — Z23 Encounter for immunization: Secondary | ICD-10-CM | POA: Diagnosis not present

## 2017-10-07 DIAGNOSIS — E039 Hypothyroidism, unspecified: Secondary | ICD-10-CM | POA: Diagnosis not present

## 2017-10-07 DIAGNOSIS — M810 Age-related osteoporosis without current pathological fracture: Secondary | ICD-10-CM

## 2017-10-07 MED ORDER — LISINOPRIL 10 MG PO TABS: 10.0000 mg | ORAL_TABLET | Freq: Every day | ORAL | 3 refills | Status: DC

## 2017-10-07 NOTE — Progress Notes (Signed)
HPI: Christina [name] is a 84 y.o. female who  has a past medical history of Hyperlipidemia and Hypertension.  she presents to [facility] today, 10/07/17,  for chief complaint of: Annual Check-up See headings below for chronic issues   Annual physical exam - see below for preventive care review.   Hypothyroidism - needs refill, due for labs   Osteoporosis - stopped Fosamax, not sure she wants to try going back on it or new treatment.   Hypercalcemia & Hyperparathyroidism - due for labs  Essential hypertension - white coat HTN, home BP on verified monitor in 130s, BP cuff today matches ours w/in 10 mmHg   Hyperlipidemia - watching diet okay  Constipation, unspecified constipation type - doing better w/ prn MiraLax  Need for pneumococcal vaccination - Pna 23 looks like on record, not confirmed.    Past medical, surgical, social and family history reviewed:  Patient Active Problem List   Diagnosis Date Noted  . Constipation 09/17/2016  . Skin lesion 09/17/2016  . Annual physical exam 03/29/2016  . Primary osteoarthritis of both knees 01/17/2016  . Hypothyroid 06/07/2015  . Osteoporosis 04/17/2015  . Hyperparathyroidism ([place]) 02/07/2015  . Hypercalcemia 01/27/2015  . Essential hypertension, benign 04/28/2013  . Hyperlipidemia 04/28/2013    No past surgical history on file.  Social History   Tobacco Use  . Smoking status: Never Smoker  . Smokeless tobacco: Never Used  Substance Use Topics  . Alcohol use: No    No family history on file.   Current medication list and allergy/intolerance information reviewed:    Current Outpatient Medications  Medication Sig Dispense Refill  . aspirin 81 MG tablet Take 81 mg by mouth daily. Two tablets daily    . cholecalciferol (VITAMIN D) 1000 units tablet Take 1,000 Units by mouth daily.    Marland Kitchen levothyroxine (SYNTHROID, LEVOTHROID) 50 MCG tablet Take 1 tablet (50 mcg total) by mouth daily. 90  tablet 3  . lisinopril (PRINIVIL,ZESTRIL) 10 MG tablet Take 1 tablet (10 mg total) by mouth daily. Must keep upcoming appt for further RF 90 tablet 0  . alendronate (FOSAMAX) 70 MG tablet Take 1 tablet (70 mg total) by mouth every 7 (seven) days. Take with a full glass of water on an empty stomach. Repeat bone density scan Oct. 2017 (Patient not taking: Reported on 10/07/2017) 4 tablet 11  . bisacodyl (DULCOLAX) 5 MG EC tablet Take 1 tablet (5 mg total) by mouth daily as needed for moderate constipation. (Patient not taking: Reported on 10/07/2017) 30 tablet 1  . naproxen sodium (ANAPROX) 220 MG tablet Take 220 mg by mouth.     No current facility-administered medications for this visit.     Allergies  Allergen Reactions  . Sulfur Hives  . Sulfa Antibiotics Rash, Other (See Comments) and Hives    unknown       Review of Systems:  Constitutional:  No  fever, no chills, No recent illness, No unintentional weight changes. No significant fatigue.   HEENT: No  headache, no vision change, no hearing change, No sore throat, No  sinus pressure  Cardiac: No  chest pain, No  pressure, No palpitations, No  Orthopnea  Respiratory:  No  shortness of breath. No  Cough  Gastrointestinal: No  abdominal pain, No  nausea, No  vomiting,  No  blood in stool, No  diarrhea, No  constipation   Musculoskeletal: No new myalgia/arthralgia  Skin: No  Rash, No other wounds/concerning lesions  Genitourinary: No  incontinence  Hem/Onc: No  easy bruising/bleeding  Neurologic: No  weakness, No  dizziness  Psychiatric: No  concerns with depression, No  concerns with anxiety  Exam:  BP (!) 167/72 (BP Location: Left Arm, Patient Position: Sitting, Cuff Size: Normal)   Pulse 96   Temp 98.1 F (36.7 C) (Oral)   Wt 144 lb 1.3 oz (65.4 kg)   BMI 24.73 kg/m   Constitutional: VS see above. General Appearance: alert, well-developed, well-nourished, NAD  Eyes: Normal lids and conjunctive, non-icteric  sclera  Ears, Nose, Mouth, Throat: MMM, Normal external inspection ears/nares/mouth/lips/gums. TM normal bilaterally. Pharynx/tonsils no erythema, no exudate. Nasal mucosa normal.   Neck: No masses, trachea midline. No thyroid enlargement. No tenderness/mass appreciated. No lymphadenopathy  Respiratory: Normal respiratory effort. no wheeze, no rhonchi, no rales  Cardiovascular: S1/S2 normal, no murmur, no rub/gallop auscultated. RRR. No lower extremity edema. Pedal pulse II/IV bilaterally DP and PT. No carotid bruit or JVD. No abdominal aortic bruit.  Gastrointestinal: Nontender, no masses. No hepatomegaly, no splenomegaly. No hernia appreciated. Bowel sounds normal. Rectal exam deferred.   Musculoskeletal: Gait normal. No clubbing/cyanosis of digits.   Neurological: Normal balance/coordination. No tremor. No cranial nerve deficit on limited exam. Motor and sensation intact and symmetric. Cerebellar reflexes intact.   Skin: warm, dry, intact. No rash/ulcer. No concerning nevi or subq nodules on limited exam.    Psychiatric: Normal judgment/insight. Normal mood and affect. Oriented x3.     ASSESSMENT/PLAN:   Annual physical exam - decline mammo, previous colon cancer and cervical cancer screening ok per pt, declined shingles, tetanus vax. OK w/ PCV13 and DEXA   Hyperparathyroidism ([place]) - Plan: PTH, Intact and Calcium, VITAMIN D 25 Hydroxy (Vit-D Deficiency, Fractures), CMP14+EGFR  Hypothyroidism, unspecified type - Plan: CBC, TSH  Osteoporosis, unspecified osteoporosis type, unspecified pathological fracture presence - Plan: PTH, Intact and Calcium, VITAMIN D 25 Hydroxy (Vit-D Deficiency, Fractures), DG Bone Density, CMP14+EGFR  Hypercalcemia - Plan: PTH, Intact and Calcium, VITAMIN D 25 Hydroxy (Vit-D Deficiency, Fractures), CMP14+EGFR  Essential hypertension - Plan: CBC, CMP14+EGFR  Hyperlipidemia, unspecified hyperlipidemia type - Plan: Lipid panel  Constipation, unspecified  constipation type  Need for pneumococcal vaccination - Plan: Pneumococcal conjugate vaccine 13-valent  Advance directive discussed with patient - daughter is HCPOA, no documentation    FEMALE PREVENTIVE CARE Updated 10/07/17   ANNUAL SCREENING/COUNSELING  Diet/Exercise - HEALTHY HABITS DISCUSSED TO DECREASE CV RISK Social History   Tobacco Use  Smoking Status Never Smoker  Smokeless Tobacco Never Used   Social History   Substance and Sexual Activity  Alcohol Use No    Depression screen PHQ 2/9 10/07/2017  Decreased Interest 0  Down, Depressed, Hopeless 0  PHQ - 2 Score 0    Domestic violence concerns - no  HTN SCREENING - SEE [place]  Sexually active in the past year - No  Need/want STI testing today? - no  INFECTIOUS DISEASE SCREENING  HIV - does not need  GC/CT - does not need  HepC - DOB [birth year] - does not need  TB - does not need  DISEASE SCREENING  Lipid - needs  DM2 - needs  Osteoporosis - women age 26+ - needs follow-up on known disease   CANCER SCREENING  Cervical - does not need  Breast - does not need  Lung - does not need  Colon - does not need  ADULT VACCINATION  Influenza - annual vaccine recommended  Td - booster every 10 years  Zoster - Shingrix recommended 50+  PCV13 - was given  PPSV23 - was not indicated Immunization History  Administered Date(s) Administered  . Influenza, High Dose Seasonal PF 07/29/2015, 05/18/2017  . Influenza,inj,Quad PF,6+ Mos 03/29/2016  . Influenza-Unspecified 05/03/2007  . Pneumococcal Conjugate-13 10/07/2017  . Pneumococcal Polysaccharide-23 07/02/1998    OTHER  Fall - exercise and Vit D age 75+ - needs         Patient Instructions  Preventive Care 71 Years and Older, Female Preventive care refers to lifestyle choices and visits with your health care provider that can promote health and wellness. What does preventive care include?  A yearly physical exam.  This is also called an annual well check.  Dental exams once or twice a year.  Routine eye exams. Ask your health care provider how often you should have your eyes checked.  Personal lifestyle choices, including: ? Daily care of your teeth and gums. ? Regular physical activity. ? Eating a healthy diet. ? Avoiding tobacco and drug use. ? Limiting alcohol use. ? Practicing safe sex. ? Taking low-dose aspirin every day. ? Taking vitamin and mineral supplements as recommended by your health care provider. What happens during an annual well check? The services and screenings done by your health care provider during your annual well check will depend on your age, overall health, lifestyle risk factors, and family history of disease. Counseling Your health care provider may ask you questions about your:  Alcohol use.  Tobacco use.  Drug use.  Emotional well-being.  Home and relationship well-being.  Sexual activity.  Eating habits.  History of falls.  Memory and ability to understand (cognition).  Work and work Statistician.  Reproductive health.  Screening You may have the following tests or measurements:  Height, weight, and BMI.  Blood pressure.  Lipid and cholesterol levels. These may be checked every 5 years, or more frequently if you are over 44 years old.  Skin check.  Lung cancer screening. You may have this screening every year starting at age 76 if you have a 30-pack-year history of smoking and currently smoke or have quit within the past 15 years.  Fecal occult blood test (FOBT) of the stool. You may have this test every year starting at age 1.  Flexible sigmoidoscopy or colonoscopy. You may have a sigmoidoscopy every 5 years or a colonoscopy every 10 years starting at age 46.  Hepatitis C blood test.  Hepatitis B blood test.  Sexually transmitted disease (STD) testing.  Diabetes screening. This is done by checking your blood sugar (glucose) after  you have not eaten for a while (fasting). You may have this done every 1-3 years.  Bone density scan. This is done to screen for osteoporosis. You may have this done starting at age 36.  Mammogram. This may be done every 1-2 years. Talk to your health care provider about how often you should have regular mammograms.  Talk with your health care provider about your test results, treatment options, and if necessary, the need for more tests. Vaccines Your health care provider may recommend certain vaccines, such as:  Influenza vaccine. This is recommended every year.  Tetanus, diphtheria, and acellular pertussis (Tdap, Td) vaccine. You may need a Td booster every 10 years.  Varicella vaccine. You may need this if you have not been vaccinated.  Zoster vaccine. You may need this after age 66.  Measles, mumps, and rubella (MMR) vaccine. You may need at least one dose of MMR if you  were born in [birth year] or later. You may also need a second dose.  Pneumococcal 13-valent conjugate (PCV13) vaccine. One dose is recommended after age 13.  Pneumococcal polysaccharide (PPSV23) vaccine. One dose is recommended after age 23.  Meningococcal vaccine. You may need this if you have certain conditions.  Hepatitis A vaccine. You may need this if you have certain conditions or if you travel or work in places where you may be exposed to hepatitis A.  Hepatitis B vaccine. You may need this if you have certain conditions or if you travel or work in places where you may be exposed to hepatitis B.  Haemophilus influenzae type b (Hib) vaccine. You may need this if you have certain conditions.  Talk to your health care provider about which screenings and vaccines you need and how often you need them. This information is not intended to replace advice given to you by your health care provider. Make sure you discuss any questions you have with your health care provider. Document Released: 07/15/2015 Document Revised:  03/07/2016 Document Reviewed: 04/19/2015 Elsevier Interactive Patient Education  2018 [employer].     Visit summary with medication list and pertinent instructions was printed for patient to review. All questions at time of visit were answered - patient instructed to contact office with any additional concerns. ER/RTC precautions were reviewed with the patient.   Follow-up plan: Return for talk about osteoporosis treatments depending on results, otherwise 6 months for blood pressure check.  Note: Total time spent 25 minutes on problem based portion of visit, greater than 50% of the visit was spent face-to-face counseling and coordinating care for the following: Hyperparathyroidism ([place]), Hypothyroidism, unspecified type, Osteoporosis, unspecified osteoporosis type, unspecified pathological fracture presence, Hypercalcemia, Essential hypertension, Hyperlipidemia, unspecified hyperlipidemia type, Constipation, unspecified constipation type, and Need for pneumococcal vaccination were also pertinent to this visit.Marland Kitchen  Please note: voice recognition software was used to produce this document, and typos may escape review. Please contact Dr. Sheppard Coil for any needed clarifications.

## 2017-10-07 NOTE — Patient Instructions (Signed)
Preventive Care 82 Years and Older, Female Preventive care refers to lifestyle choices and visits with your health care provider that can promote health and wellness. What does preventive care include?  A yearly physical exam. This is also called an annual well check.  Dental exams once or twice a year.  Routine eye exams. Ask your health care provider how often you should have your eyes checked.  Personal lifestyle choices, including: ? Daily care of your teeth and gums. ? Regular physical activity. ? Eating a healthy diet. ? Avoiding tobacco and drug use. ? Limiting alcohol use. ? Practicing safe sex. ? Taking low-dose aspirin every day. ? Taking vitamin and mineral supplements as recommended by your health care provider. What happens during an annual well check? The services and screenings done by your health care provider during your annual well check will depend on your age, overall health, lifestyle risk factors, and family history of disease. Counseling Your health care provider may ask you questions about your:  Alcohol use.  Tobacco use.  Drug use.  Emotional well-being.  Home and relationship well-being.  Sexual activity.  Eating habits.  History of falls.  Memory and ability to understand (cognition).  Work and work environment.  Reproductive health.  Screening You may have the following tests or measurements:  Height, weight, and BMI.  Blood pressure.  Lipid and cholesterol levels. These may be checked every 5 years, or more frequently if you are over 50 years old.  Skin check.  Lung cancer screening. You may have this screening every year starting at age 55 if you have a 30-pack-year history of smoking and currently smoke or have quit within the past 15 years.  Fecal occult blood test (FOBT) of the stool. You may have this test every year starting at age 50.  Flexible sigmoidoscopy or colonoscopy. You may have a sigmoidoscopy every 5 years or  a colonoscopy every 10 years starting at age 50.  Hepatitis C blood test.  Hepatitis B blood test.  Sexually transmitted disease (STD) testing.  Diabetes screening. This is done by checking your blood sugar (glucose) after you have not eaten for a while (fasting). You may have this done every 1-3 years.  Bone density scan. This is done to screen for osteoporosis. You may have this done starting at age 65.  Mammogram. This may be done every 1-2 years. Talk to your health care provider about how often you should have regular mammograms.  Talk with your health care provider about your test results, treatment options, and if necessary, the need for more tests. Vaccines Your health care provider may recommend certain vaccines, such as:  Influenza vaccine. This is recommended every year.  Tetanus, diphtheria, and acellular pertussis (Tdap, Td) vaccine. You may need a Td booster every 10 years.  Varicella vaccine. You may need this if you have not been vaccinated.  Zoster vaccine. You may need this after age 60.  Measles, mumps, and rubella (MMR) vaccine. You may need at least one dose of MMR if you were born in 1957 or later. You may also need a second dose.  Pneumococcal 13-valent conjugate (PCV13) vaccine. One dose is recommended after age 65.  Pneumococcal polysaccharide (PPSV23) vaccine. One dose is recommended after age 65.  Meningococcal vaccine. You may need this if you have certain conditions.  Hepatitis A vaccine. You may need this if you have certain conditions or if you travel or work in places where you may be exposed to hepatitis   A.  Hepatitis B vaccine. You may need this if you have certain conditions or if you travel or work in places where you may be exposed to hepatitis B.  Haemophilus influenzae type b (Hib) vaccine. You may need this if you have certain conditions.  Talk to your health care provider about which screenings and vaccines you need and how often you  need them. This information is not intended to replace advice given to you by your health care provider. Make sure you discuss any questions you have with your health care provider. Document Released: 07/15/2015 Document Revised: 03/07/2016 Document Reviewed: 04/19/2015 Elsevier Interactive Patient Education  2018 Elsevier Inc[employer]  

## 2017-10-09 LAB — CBC
HEMATOCRIT: 39.8 % (ref 34.0–46.6)
Hemoglobin: 12.6 g/dL (ref 11.1–15.9)
MCH: 28.6 pg (ref 26.6–33.0)
MCHC: 31.7 g/dL (ref 31.5–35.7)
MCV: 91 fL (ref 79–97)
Platelets: 240 10*3/uL (ref 150–379)
RBC: 4.4 x10E6/uL (ref 3.77–5.28)
RDW: 14.6 % (ref 12.3–15.4)
WBC: 4.7 10*3/uL (ref 3.4–10.8)

## 2017-10-09 LAB — CMP14+EGFR
A/G RATIO: 1.6 (ref 1.2–2.2)
ALT: 11 IU/L (ref 0–32)
AST: 17 IU/L (ref 0–40)
Albumin: 4.1 g/dL (ref 3.2–4.6)
Alkaline Phosphatase: 89 IU/L (ref 39–117)
BILIRUBIN TOTAL: 0.3 mg/dL (ref 0.0–1.2)
BUN / CREAT RATIO: 19 (ref 12–28)
BUN: 15 mg/dL (ref 10–36)
CHLORIDE: 105 mmol/L (ref 96–106)
CO2: 24 mmol/L (ref 20–29)
Calcium: 10.1 mg/dL (ref 8.7–10.3)
Creatinine, Ser: 0.78 mg/dL (ref 0.57–1.00)
GFR calc non Af Amer: 67 mL/min/{1.73_m2} (ref >59)
GFR, EST AFRICAN AMERICAN: 77 mL/min/{1.73_m2} (ref >59)
GLOBULIN, TOTAL: 2.6 g/dL (ref 1.5–4.5)
Glucose: 84 mg/dL (ref 65–99)
POTASSIUM: 4.6 mmol/L (ref 3.5–5.2)
SODIUM: 140 mmol/L (ref 134–144)
TOTAL PROTEIN: 6.7 g/dL (ref 6.0–8.5)

## 2017-10-09 LAB — LIPID PANEL
CHOLESTEROL TOTAL: 215 mg/dL — AB (ref 100–199)
Chol/HDL Ratio: 3.5 ratio (ref 0.0–4.4)
HDL: 62 mg/dL (ref >39)
LDL Calculated: 132 mg/dL — ABNORMAL HIGH (ref 0–99)
TRIGLYCERIDES: 103 mg/dL (ref 0–149)
VLDL Cholesterol Cal: 21 mg/dL (ref 5–40)

## 2017-10-09 LAB — VITAMIN D 25 HYDROXY (VIT D DEFICIENCY, FRACTURES): Vit D, 25-Hydroxy: 41.9 ng/mL (ref 30.0–100.0)

## 2017-10-09 LAB — PTH, INTACT AND CALCIUM: PTH: 90 pg/mL — ABNORMAL HIGH (ref 15–65)

## 2017-10-09 LAB — TSH: TSH: 0.966 u[IU]/mL (ref 0.450–4.500)

## 2017-10-10 ENCOUNTER — Other Ambulatory Visit: Payer: Self-pay | Admitting: Osteopathic Medicine

## 2017-10-10 DIAGNOSIS — E039 Hypothyroidism, unspecified: Secondary | ICD-10-CM

## 2017-10-16 ENCOUNTER — Ambulatory Visit (INDEPENDENT_AMBULATORY_CARE_PROVIDER_SITE_OTHER): Payer: 59

## 2017-10-16 DIAGNOSIS — M810 Age-related osteoporosis without current pathological fracture: Secondary | ICD-10-CM | POA: Diagnosis not present

## 2017-10-28 ENCOUNTER — Telehealth: Payer: Self-pay

## 2017-10-28 NOTE — Telephone Encounter (Signed)
Pt called requesting Dexa results. She also wants to know if provider will be sending a rx for osteoporosis. Pt request copy of Dexa results mailed to her home. Pls advise, thanks.

## 2017-11-01 NOTE — Telephone Encounter (Signed)
Advised pt of Dr Mardelle Matte note. Pt is scheduled for OV on 11-13-17 to discuss treatment options.  DEXA has been mailed to pt.

## 2017-11-01 NOTE — Telephone Encounter (Signed)
With her history of calcium issues, I would not feel comfortable prescribing an osteoporosis medication without first talking to her about the different options.  She needs an office visit.

## 2017-11-13 ENCOUNTER — Encounter: Payer: Self-pay | Admitting: Osteopathic Medicine

## 2017-11-13 ENCOUNTER — Ambulatory Visit: Payer: 59 | Admitting: Osteopathic Medicine

## 2017-11-13 VITALS — BP 169/65 | HR 88 | Temp 98.2°F | Wt 146.1 lb

## 2017-11-13 DIAGNOSIS — I10 Essential (primary) hypertension: Secondary | ICD-10-CM | POA: Diagnosis not present

## 2017-11-13 DIAGNOSIS — M810 Age-related osteoporosis without current pathological fracture: Secondary | ICD-10-CM

## 2017-11-13 MED ORDER — ALENDRONATE SODIUM 10 MG PO TABS: 10.0000 mg | ORAL_TABLET | Freq: Every day | ORAL | 3 refills | Status: DC

## 2017-11-13 NOTE — Patient Instructions (Signed)
140 top number, 90 bottom number - goal BP at home.  If higher than this, wait 5 minutes and recheck.  If numbers are consistently higher than this, come see me

## 2017-11-13 NOTE — Progress Notes (Signed)
HPI: Christina [name] is a 84 y.o. female who  has a past medical history of Hyperlipidemia and Hypertension.  she presents to [facility] today, 11/13/17,  for chief complaint of:  Discuss osteoporosis medications   Previously on Fosamax, stopped this. Not sure how long was on it but pretty sure only about a year. She was having some leg pain and though Fosamax might be the issue, she stopped the Fosamax   Hx elevated calcium levels, recent check was okay.   HTN: white coat, home BP ok per patient, we verified her home monitor earlier this year. No CP/SOB.    Past medical history, surgical history, and family history reviewed.  Current medication list and allergy/intolerance information reviewed.   (See remainder of HPI, ROS, Phys Exam below)    ASSESSMENT/PLAN: Restart fosamax and discussed other fracture prevention measures. I doubt the leg pain she described had anything to do with the medications. Consider prolia if recurrence.   Age-related osteoporosis without current pathological fracture  Essential hypertension   Meds ordered this encounter  Medications  . alendronate (FOSAMAX) 10 MG tablet    Sig: Take 1 tablet (10 mg total) by mouth daily before breakfast.    Dispense:  90 tablet    Refill:  3    Patient Instructions  140 top number, 90 bottom number - goal BP at home.  If higher than this, wait 5 minutes and recheck.  If numbers are consistently higher than this, come see me    Follow-up plan: Return in about 3 months (around 02/13/2018) for recheck BP and labs, sooner if needed .     ############################################ ############################################ ############################################ ############################################    Outpatient Encounter Medications as of 11/13/2017  Medication Sig Note  . aspirin 81 MG tablet Take 81 mg by mouth daily. Two tablets daily   .  cholecalciferol (VITAMIN D) 1000 units tablet Take 1,000 Units by mouth daily. 11/13/2017: As per pt, taking 2000 mg  daily  . Cod Liver Oil 1000 MG CAPS Take by mouth.   . Hyaluronic Acid-Vitamin C (HYALURONIC ACID PO) Take by mouth.   . levothyroxine (SYNTHROID, LEVOTHROID) 50 MCG tablet TAKE 1 TABLET BY MOUTH DAILY   . lisinopril (PRINIVIL,ZESTRIL) 10 MG tablet Take 1 tablet (10 mg total) by mouth daily. Must keep upcoming appt for further RF   . naproxen sodium (ANAPROX) 220 MG tablet Take 220 mg by mouth. 11/13/2017: PRN  . vitamin A 7500 UNIT capsule Take 7,500 Units by mouth daily.    No facility-administered encounter medications on file as of 11/13/2017.    Allergies  Allergen Reactions  . Sulfur Hives  . Sulfa Antibiotics Rash, Other (See Comments) and Hives    unknown       Review of Systems:  Constitutional: No recent illness  HEENT: No  headache, no vision change  Cardiac: No  chest pain, No  pressure, No palpitations  Respiratory:  No  shortness of breath. No  Cough  Gastrointestinal: No  abdominal pain, no change on bowel habits  Musculoskeletal: No new myalgia/arthralgia  Skin: No  Rash  Neurologic: No  weakness, No  Dizziness   Exam:  BP (!) 169/65 (BP Location: Right Arm, Patient Position: Sitting, Cuff Size: Normal)   Pulse 88   Temp 98.2 F (36.8 C) (Oral)   Wt 146 lb 1.6 oz (66.3 kg)   BMI 25.08 kg/m   Constitutional: VS see above. General Appearance: alert, well-developed, well-nourished, NAD  Eyes:  Normal lids and conjunctive, non-icteric sclera  Ears, Nose, Mouth, Throat: MMM, Normal external inspection ears/nares/mouth/lips/gums.  Neck: No masses, trachea midline.   Respiratory: Normal respiratory effort. no wheeze, no rhonchi, no rales  Cardiovascular: S1/S2 normal,  no rub/gallop auscultated. RRR.   Musculoskeletal: Gait normal. Symmetric and independent movement of all extremities  Neurological: Normal balance/coordination. No  tremor.  Skin: warm, dry, intact.   Psychiatric: Normal judgment/insight. Normal mood and affect. Oriented x3.   Visit summary with medication list and pertinent instructions was printed for patient to review, advised to alert [place] if any changes needed. All questions at time of visit were answered - patient instructed to contact office with any additional concerns. ER/RTC precautions were reviewed with the patient and understanding verbalized.   Follow-up plan: Return in about 3 months (around 02/13/2018) for recheck BP and labs, sooner if needed .  Note: Total time spent 25 minutes, greater than 50% of the visit was spent face-to-face counseling and coordinating care for the following: The primary encounter diagnosis was Age-related osteoporosis without current pathological fracture. A diagnosis of Essential hypertension was also pertinent to this visit.Marland Kitchen  Please note: voice recognition software was used to produce this document, and typos may escape review. Please contact Dr. Lyn Hollingshead for any needed clarifications.

## 2018-10-08 ENCOUNTER — Ambulatory Visit (INDEPENDENT_AMBULATORY_CARE_PROVIDER_SITE_OTHER): Payer: 59 | Admitting: Osteopathic Medicine

## 2018-10-08 ENCOUNTER — Ambulatory Visit: Payer: 59 | Admitting: Osteopathic Medicine

## 2018-10-08 ENCOUNTER — Encounter: Payer: Self-pay | Admitting: Osteopathic Medicine

## 2018-10-08 ENCOUNTER — Other Ambulatory Visit: Payer: Self-pay | Admitting: Osteopathic Medicine

## 2018-10-08 ENCOUNTER — Other Ambulatory Visit: Payer: Self-pay

## 2018-10-08 VITALS — BP 158/60 | HR 105 | Wt 150.0 lb

## 2018-10-08 DIAGNOSIS — I10 Essential (primary) hypertension: Secondary | ICD-10-CM

## 2018-10-08 DIAGNOSIS — E039 Hypothyroidism, unspecified: Secondary | ICD-10-CM | POA: Diagnosis not present

## 2018-10-08 DIAGNOSIS — E213 Hyperparathyroidism, unspecified: Secondary | ICD-10-CM | POA: Diagnosis not present

## 2018-10-08 DIAGNOSIS — E785 Hyperlipidemia, unspecified: Secondary | ICD-10-CM

## 2018-10-08 MED ORDER — LISINOPRIL 10 MG PO TABS
10.0000 mg | ORAL_TABLET | Freq: Every day | ORAL | 0 refills | Status: DC
Start: 2018-10-08 — End: 2018-12-01

## 2018-10-08 MED ORDER — LEVOTHYROXINE SODIUM 50 MCG PO TABS: 50.0000 ug | ORAL_TABLET | Freq: Every day | ORAL | 0 refills | Status: DC

## 2018-10-08 NOTE — Progress Notes (Signed)
Virtual Visit  via Video or Phone Note  I connected with      Christina [name] on 10/08/18 at 1:57 PM by a telemedicine application and verified that I am speaking with the correct person using two identifiers.   I discussed the limitations of evaluation and management by telemedicine and the availability of in person appointments. The patient expressed understanding and agreed to proceed.  History of Present Illness: Christina [name] is a 85 y.o. female who would like to discuss BP   HTN: Declined recheck BP, monitor was in the other room and she didn't want to get up again. Reports daily readings have been "pretty much like this" at 150's/60's. Home BP monitor was verified last year.   Hypothyroid: TSH wnl a year ago   Osteoporosis: no falls, still taking Fosamax. Last Dexa 09/2017     Observations/Objective: BP (!) 158/60 (BP Location: Left Arm, Patient Position: Sitting, Cuff Size: Normal)   Pulse (!) 105   Wt 150 lb (68 kg)   BMI 25.75 kg/m  BP Readings from Last 3 Encounters:  10/08/18 (!) 158/60  11/13/17 (!) 169/65  10/07/17 (!) 167/72   Exam: Normal Speech.    Lab and Radiology Results No results found for this or any previous visit (from the past 72 hour(s)). No results found.     Assessment and Plan: 85 y.o. female with The primary encounter diagnosis was Essential hypertension, benign. Diagnoses of Hyperparathyroidism ([place]), Hypothyroidism, unspecified type, Hypercalcemia, and Hyperlipidemia, unspecified hyperlipidemia type were also pertinent to this visit.  Meds ordered this encounter  Medications  . levothyroxine (SYNTHROID, LEVOTHROID) 50 MCG tablet    Sig: Take 1 tablet (50 mcg total) by mouth daily.    Dispense:  90 tablet    Refill:  0    DUE FOR LABS PRIOR TO AUTHORIZATION OF ADDITIONAL REFILLS  . lisinopril (PRINIVIL,ZESTRIL) 10 MG tablet    Sig: Take 1 tablet (10 mg total) by mouth daily.    Dispense:  90 tablet    Refill:  0    DUE FOR LABS  PRIOR TO AUTHORIZATION OF ADDITIONAL REFILLS   Orders Placed This Encounter  Procedures  . CBC  . Lipid panel  . TSH  . VITAMIN D 25 Hydroxy (Vit-D Deficiency, Fractures)  . CMP14+EGFR  . PTH, Intact and Calcium    PDMP not reviewed this encounter. Orders Placed This Encounter  Procedures  . CBC  . Lipid panel  . TSH  . VITAMIN D 25 Hydroxy (Vit-D Deficiency, Fractures)  . CMP14+EGFR  . PTH, Intact and Calcium   Meds ordered this encounter  Medications  . levothyroxine (SYNTHROID, LEVOTHROID) 50 MCG tablet    Sig: Take 1 tablet (50 mcg total) by mouth daily.    Dispense:  90 tablet    Refill:  0    DUE FOR LABS PRIOR TO AUTHORIZATION OF ADDITIONAL REFILLS  . lisinopril (PRINIVIL,ZESTRIL) 10 MG tablet    Sig: Take 1 tablet (10 mg total) by mouth daily.    Dispense:  90 tablet    Refill:  0    DUE FOR LABS PRIOR TO AUTHORIZATION OF ADDITIONAL REFILLS      Follow Up Instructions: Return for LAB VISIT NEXT 1-2 MOS .    I discussed the assessment and treatment plan with the patient. The patient was provided an opportunity to ask questions and all were answered. The patient agreed with the plan and demonstrated an understanding of the instructions.   The  patient was advised to call back or seek an in-person evaluation if the symptoms worsen or if the condition fails to improve as anticipated.  I provided 11 minutes of non-face-to-face time during this encounter.                      Historical information moved to improve visibility of documentation.  Past Medical History:  Diagnosis Date  . Hyperlipidemia   . Hypertension    No past surgical history on file. Social History   Tobacco Use  . Smoking status: Never Smoker  . Smokeless tobacco: Never Used  Substance Use Topics  . Alcohol use: No   family history is not on file.  Medications: Current Outpatient Medications  Medication Sig Dispense Refill  . alendronate (FOSAMAX) 10 MG tablet  Take 1 tablet (10 mg total) by mouth daily before breakfast. 90 tablet 3  . aspirin 81 MG tablet Take 81 mg by mouth daily. Two tablets daily    . cholecalciferol (VITAMIN D) 1000 units tablet Take 1,000 Units by mouth daily.    Marland Kitchen Cod Liver Oil 1000 MG CAPS Take by mouth.    . Hyaluronic Acid-Vitamin C (HYALURONIC ACID PO) Take by mouth.    . levothyroxine (SYNTHROID, LEVOTHROID) 50 MCG tablet Take 1 tablet (50 mcg total) by mouth daily. 90 tablet 0  . lisinopril (PRINIVIL,ZESTRIL) 10 MG tablet Take 1 tablet (10 mg total) by mouth daily. 90 tablet 0  . naproxen sodium (ANAPROX) 220 MG tablet Take 220 mg by mouth.    . vitamin A 7500 UNIT capsule Take 7,500 Units by mouth daily.     No current facility-administered medications for this visit.    Allergies  Allergen Reactions  . Sulfur Hives  . Sulfa Antibiotics Rash, Other (See Comments) and Hives    unknown     PDMP not reviewed this encounter. Orders Placed This Encounter  Procedures  . CBC  . Lipid panel  . TSH  . VITAMIN D 25 Hydroxy (Vit-D Deficiency, Fractures)  . CMP14+EGFR  . PTH, Intact and Calcium   Meds ordered this encounter  Medications  . levothyroxine (SYNTHROID, LEVOTHROID) 50 MCG tablet    Sig: Take 1 tablet (50 mcg total) by mouth daily.    Dispense:  90 tablet    Refill:  0    DUE FOR LABS PRIOR TO AUTHORIZATION OF ADDITIONAL REFILLS  . lisinopril (PRINIVIL,ZESTRIL) 10 MG tablet    Sig: Take 1 tablet (10 mg total) by mouth daily.    Dispense:  90 tablet    Refill:  0    DUE FOR LABS PRIOR TO AUTHORIZATION OF ADDITIONAL REFILLS

## 2018-11-29 LAB — CBC
Hematocrit: 37.6 % (ref 34.0–46.6)
Hemoglobin: 12.9 g/dL (ref 11.1–15.9)
MCH: 30.4 pg (ref 26.6–33.0)
MCHC: 34.3 g/dL (ref 31.5–35.7)
MCV: 89 fL (ref 79–97)
Platelets: 239 10*3/uL (ref 150–450)
RBC: 4.25 x10E6/uL (ref 3.77–5.28)
RDW: 13.1 % (ref 11.7–15.4)
WBC: 4.6 10*3/uL (ref 3.4–10.8)

## 2018-11-29 LAB — PTH, INTACT AND CALCIUM: PTH: 53 pg/mL (ref 15–65)

## 2018-11-29 LAB — CMP14+EGFR
ALT: 9 IU/L (ref 0–32)
AST: 13 IU/L (ref 0–40)
Albumin/Globulin Ratio: 1.9 (ref 1.2–2.2)
Albumin: 4.4 g/dL (ref 3.5–4.6)
Alkaline Phosphatase: 80 IU/L (ref 39–117)
BUN/Creatinine Ratio: 24 (ref 12–28)
BUN: 17 mg/dL (ref 10–36)
Bilirubin Total: 0.3 mg/dL (ref 0.0–1.2)
CO2: 24 mmol/L (ref 20–29)
Calcium: 10.8 mg/dL — ABNORMAL HIGH (ref 8.7–10.3)
Chloride: 105 mmol/L (ref 96–106)
Creatinine, Ser: 0.72 mg/dL (ref 0.57–1.00)
GFR calc Af Amer: 85 mL/min/{1.73_m2} (ref >59)
GFR calc non Af Amer: 73 mL/min/{1.73_m2} (ref >59)
Globulin, Total: 2.3 g/dL (ref 1.5–4.5)
Glucose: 91 mg/dL (ref 65–99)
Potassium: 4.8 mmol/L (ref 3.5–5.2)
Sodium: 143 mmol/L (ref 134–144)
Total Protein: 6.7 g/dL (ref 6.0–8.5)

## 2018-11-29 LAB — LIPID PANEL
Chol/HDL Ratio: 3.8 ratio (ref 0.0–4.4)
Cholesterol, Total: 223 mg/dL — ABNORMAL HIGH (ref 100–199)
HDL: 58 mg/dL (ref >39)
LDL Calculated: 143 mg/dL — ABNORMAL HIGH (ref 0–99)
Triglycerides: 111 mg/dL (ref 0–149)
VLDL Cholesterol Cal: 22 mg/dL (ref 5–40)

## 2018-11-29 LAB — TSH: TSH: 1.28 u[IU]/mL (ref 0.450–4.500)

## 2018-11-29 LAB — VITAMIN D 25 HYDROXY (VIT D DEFICIENCY, FRACTURES): Vit D, 25-Hydroxy: 28 ng/mL — ABNORMAL LOW (ref 30.0–100.0)

## 2018-12-01 ENCOUNTER — Other Ambulatory Visit: Payer: Self-pay | Admitting: Osteopathic Medicine

## 2018-12-01 MED ORDER — VITAMIN D (ERGOCALCIFEROL) 1.25 MG (50000 UNIT) PO CAPS: 50000.0000 [IU] | ORAL_CAPSULE | ORAL | 0 refills | Status: DC

## 2018-12-01 MED ORDER — LEVOTHYROXINE SODIUM 50 MCG PO TABS: 50.0000 ug | ORAL_TABLET | Freq: Every day | ORAL | 1 refills | Status: DC

## 2018-12-01 MED ORDER — LISINOPRIL 10 MG PO TABS: 10.0000 mg | ORAL_TABLET | Freq: Every day | ORAL | 1 refills | Status: DC

## 2019-01-22 ENCOUNTER — Telehealth: Payer: Self-pay

## 2019-01-22 DIAGNOSIS — E213 Hyperparathyroidism, unspecified: Secondary | ICD-10-CM

## 2019-01-22 NOTE — Telephone Encounter (Signed)
PT called stating that she went to [place] but no order was placed. As per pt - due to insurance she must have labs drawn at [facility]. Please place new order for [facility]. Pt will have labs done tomorrow. Thanks.

## 2019-01-22 NOTE — Telephone Encounter (Signed)
And on, I am confused.  You said she has to get labs drawn at [place]?  The orders are already in for [facility] for her to come to the lab on the first floor of this building?  Can we clarify this, and if she needs them to go to [facility], we need to know which facility she will be going to so that we can fax the orders.

## 2019-01-22 NOTE — Telephone Encounter (Signed)
Ok orders re in

## 2019-01-22 NOTE — Telephone Encounter (Signed)
My apologies - pt must have labs drawn at [place].

## 2019-01-23 NOTE — Telephone Encounter (Signed)
Left vm msg for pt on 01/22/19 with an update regarding [facility] order. Direct call back info provided.

## 2019-01-27 LAB — PARATHYROID HORMONE, INTACT (NO CA): PTH: 44 pg/mL (ref 15–65)

## 2019-01-27 LAB — VITAMIN D 25 HYDROXY (VIT D DEFICIENCY, FRACTURES): Vit D, 25-Hydroxy: 44.8 ng/mL (ref 30.0–100.0)

## 2019-01-27 LAB — CALCIUM, IONIZED: Calcium, Ion: 5.9 mg/dL — ABNORMAL HIGH (ref 4.5–5.6)

## 2019-04-13 ENCOUNTER — Telehealth: Payer: Self-pay

## 2019-04-13 MED ORDER — NAPROXEN SODIUM 220 MG PO TABS: 220.0000 mg | ORAL_TABLET | Freq: Two times a day (BID) | ORAL | 0 refills | Status: DC | PRN

## 2019-04-13 NOTE — Telephone Encounter (Signed)
Pt called requesting med refill for naproxen sodium. No reason given why pt wanted a refill for medication. Rx written by historical provider. Pls send rx to [facility].

## 2019-04-14 ENCOUNTER — Telehealth: Payer: Self-pay

## 2019-04-14 ENCOUNTER — Other Ambulatory Visit: Payer: Self-pay

## 2019-04-14 MED ORDER — ALENDRONATE SODIUM 10 MG PO TABS: 10.0000 mg | ORAL_TABLET | Freq: Every day | ORAL | 0 refills | Status: DC

## 2019-04-14 NOTE — Telephone Encounter (Signed)
Opened in error

## 2019-05-12 ENCOUNTER — Other Ambulatory Visit: Payer: Self-pay | Admitting: Osteopathic Medicine

## 2019-05-12 NOTE — Telephone Encounter (Signed)
[facility] requesting med refill for thyroid med. Pt's last thyroid check was on 11/28/18, levels were within normal range. Pls advise if refill appropriate.

## 2019-05-30 ENCOUNTER — Other Ambulatory Visit: Payer: Self-pay | Admitting: Osteopathic Medicine

## 2019-05-30 NOTE — Telephone Encounter (Signed)
Forwarding medication refill request to the clinical pool for review. 

## 2019-08-24 ENCOUNTER — Other Ambulatory Visit: Payer: Self-pay | Admitting: Osteopathic Medicine

## 2019-09-02 ENCOUNTER — Encounter: Payer: Self-pay | Admitting: Osteopathic Medicine

## 2019-09-02 ENCOUNTER — Ambulatory Visit (INDEPENDENT_AMBULATORY_CARE_PROVIDER_SITE_OTHER): Payer: 59 | Admitting: Osteopathic Medicine

## 2019-09-02 ENCOUNTER — Other Ambulatory Visit: Payer: Self-pay

## 2019-09-02 VITALS — BP 185/74 | HR 107 | Temp 98.0°F | Wt 148.1 lb

## 2019-09-02 DIAGNOSIS — R000 Tachycardia, unspecified: Secondary | ICD-10-CM | POA: Diagnosis not present

## 2019-09-02 DIAGNOSIS — R002 Palpitations: Secondary | ICD-10-CM | POA: Diagnosis not present

## 2019-09-02 DIAGNOSIS — M170 Bilateral primary osteoarthritis of knee: Secondary | ICD-10-CM

## 2019-09-02 DIAGNOSIS — L578 Other skin changes due to chronic exposure to nonionizing radiation: Secondary | ICD-10-CM | POA: Diagnosis not present

## 2019-09-02 MED ORDER — PROPRANOLOL HCL 20 MG PO TABS: 20.0000 mg | ORAL_TABLET | Freq: Three times a day (TID) | ORAL | 1 refills | Status: DC | PRN

## 2019-09-02 NOTE — Progress Notes (Signed)
Christina [name] is a 86 y.o. female who presents to  [facility] at [facility]  today, 09/02/19, seeking care for the following:   Fast heart rate / palpitations      ASSESSMENT & PLAN with other pertinent history/findings:  The primary encounter diagnosis was Palpitations. Diagnoses of Sinus tachycardia, Actinic skin damage, and Primary osteoarthritis of both knees were also pertinent to this visit.  1. Palpitations 2. Sinus tachycardia Reports ongoing episodes for years, maybe 2-3 times per year, no SOB/diziness, no CP, no LOC or presyncope. EKG stable, sinus tach. Pt would like meds to take prn. Will trial propranolol, advised if drop BP or experience dizziness d/c med and call me   3. Actinic skin damage Cryotherapy applied to small area on R forehead, RTC prn   4. Primary osteoarthritis of both knees Advised f/u sports med prn, will refil Voltaren gel        EKG interpretation: Rate: 104 Rhythm: sinus No ST/T changes concerning for acute ischemia/infarct  Previous EKG 01/24/2017 no significant change     Patient Instructions  Heart: Sinus Tachycardia See below for more information Will try medication to take as needed for this  If chest pain, trouble breathing, feeling like you are going to pass out, or other concerns - please seek emergency medical help!  Skin:  Froze abnormal skin lesion today. Let me know if this comes back!   Knees: Continue Voltaren gel as needed. Schedule with Dr. Karie Schwalbe if no better      Sinus Tachycardia  Sinus tachycardia is a kind of fast heartbeat. In sinus tachycardia, the heart beats more than 100 times a minute. Sinus tachycardia starts in a part of the heart called the sinus node. Sinus tachycardia may be harmless, or it may be a sign of a serious condition. What are the causes? This condition may be caused by:  Exercise or exertion.  A fever.  Pain.  Loss of body fluids  (dehydration).  Severe bleeding (hemorrhage).  Anxiety and stress.  Certain substances, including: ? Alcohol. ? Caffeine. ? Tobacco and nicotine products. ? Cold medicines. ? Illegal drugs.  Medical conditions including: ? Heart disease. ? An infection. ? An overactive thyroid (hyperthyroidism). ? A lack of red blood cells (anemia). What are the signs or symptoms? Symptoms of this condition include:  A feeling that the heart is beating quickly (palpitations).  Suddenly noticing your heartbeat (cardiac awareness).  Dizziness.  Tiredness (fatigue).  Shortness of breath.  Chest pain.  Nausea.  Fainting. How is this diagnosed? This condition is diagnosed with:  A physical exam.  Other tests, such as: ? Blood tests. ? An electrocardiogram (ECG). This test measures the electrical activity of the heart. ? Ambulatory cardiac monitor. This records your heartbeats for 24 hours or more. You may be referred to a heart specialist (cardiologist). How is this treated? Treatment for this condition depends on the cause or the underlying condition. Treatment may involve:  Treating the underlying condition.  Taking new medicines or changing your current medicines as told by your health care provider.  Making changes to your diet or lifestyle. Follow these instructions at home: Lifestyle   Do not use any products that contain nicotine or tobacco, such as cigarettes and e-cigarettes. If you need help quitting, ask your health care provider.  Do not use illegal drugs, such as cocaine.  Learn relaxation methods to help you when you get stressed or anxious. These include deep breathing.  Avoid caffeine or other stimulants. Alcohol use   Do not drink alcohol if: ? Your health care provider tells you not to drink. ? You are pregnant, may be pregnant, or are planning to become pregnant.  If you drink alcohol, limit how much you have: ? 0-1 drink a day for women. ? 0-2  drinks a day for men.  Be aware of how much alcohol is in your drink. In the [place] one drink equals one typical bottle of beer (12 oz), one-half glass of wine (5 oz), or one shot of hard liquor (1 oz). General instructions  Drink enough fluids to keep your urine pale yellow.  Take over-the-counter and prescription medicines only as told by your health care provider.  Keep all follow-up visits as told by your health care provider. This is important. Contact a health care provider if you have:  A fever.  Vomiting or diarrhea that does not go away. Get help right away if you:  Have pain in your chest, upper arms, jaw, or neck.  Become weak or dizzy.  Feel faint.  Have palpitations that do not go away. Summary  In sinus tachycardia, the heart beats more than 100 times a minute.  Sinus tachycardia may be harmless, or it may be a sign of a serious condition.  Treatment for this condition depends on the cause or the underlying condition.  Get help right away if you have pain in your chest, upper arms, jaw, or neck. This information is not intended to replace advice given to you by your health care provider. Make sure you discuss any questions you have with your health care provider. Document Revised: 08/07/2017 Document Reviewed: 08/07/2017 Elsevier Patient Education  2020 [employer].     Orders Placed This Encounter  Procedures  . EKG 12-Lead  . Rhythm ECG, report    Meds ordered this encounter  Medications  . propranolol (INDERAL) 20 MG tablet    Sig: Take 1 tablet (20 mg total) by mouth 3 (three) times daily as needed (palpitations).    Dispense:  30 tablet    Refill:  1       Follow-up instructions: Return if symptoms worsen or fail to improve.                                         BP (!) 185/74 (BP Location: Left Arm, Patient Position: Sitting, Cuff Size: Small)   Pulse (!) 107   Temp 98 F (36.7 C) (Oral)   Wt  148 lb 1.3 oz (67.2 kg)   SpO2 96%   BMI 25.42 kg/m   Current Meds  Medication Sig  . alendronate (FOSAMAX) 10 MG tablet Take 1 tablet (10 mg total) by mouth daily before breakfast.  . aspirin 81 MG tablet Take 81 mg by mouth daily. Two tablets daily  . Aspirin Buf,CaCarb-MgCarb-MgO, 81 MG TABS Take by mouth.  . cholecalciferol (VITAMIN D) 1000 units tablet Take 1,000 Units by mouth daily.  Marland Kitchen Hyaluronic Acid-Vitamin C (HYALURONIC ACID PO) Take by mouth.  . levothyroxine (SYNTHROID) 50 MCG tablet TAKE 1 TABLET DAILY  . lisinopril (ZESTRIL) 10 MG tablet TAKE 1 TABLET DAILY  . LORazepam (ATIVAN) 0.5 MG tablet Take by mouth.  . naproxen sodium (ALEVE) 220 MG tablet Take 1 tablet (220 mg total) by mouth 2 (two) times daily as needed.  . ranitidine (ZANTAC) 150 MG capsule  Take by mouth.  . Vitamin D, Ergocalciferol, (DRISDOL) 1.25 MG (50000 UT) CAPS capsule Take 1 capsule (50,000 Units total) by mouth every 7 (seven) days. Take for 8 total doses(weeks)    No results found for this or any previous visit (from the past 72 hour(s)).  No results found.  Depression screen [facility] 2/9 10/08/2018 10/07/2017 09/17/2016  Decreased Interest 0 0 0  Down, Depressed, Hopeless 0 0 0  PHQ - 2 Score 0 0 0    GAD 7 : Generalized Anxiety Score 10/08/2018  Nervous, Anxious, on Edge 0  Control/stop worrying 0  Worry too much - different things 0  Trouble relaxing 0  Restless 0  Easily annoyed or irritable 0  Afraid - awful might happen 0  Total GAD 7 Score 0      All questions at time of visit were answered - patient instructed to contact office with any additional concerns or updates.  ER/RTC precautions were reviewed with the patient.  Please note: voice recognition software was used to produce this document, and typos may escape review. Please contact Dr. Sheppard Coil for any needed clarifications.

## 2019-09-02 NOTE — Patient Instructions (Addendum)
Heart: Sinus Tachycardia See below for more information Will try medication to take as needed for this  If chest pain, trouble breathing, feeling like you are going to pass out, or other concerns - please seek emergency medical help!  Skin:  Froze abnormal skin lesion today. Let me know if this comes back!   Knees: Continue Voltaren gel as needed. Schedule with Dr. Karie Schwalbe if no better      Sinus Tachycardia  Sinus tachycardia is a kind of fast heartbeat. In sinus tachycardia, the heart beats more than 100 times a minute. Sinus tachycardia starts in a part of the heart called the sinus node. Sinus tachycardia may be harmless, or it may be a sign of a serious condition. What are the causes? This condition may be caused by:  Exercise or exertion.  A fever.  Pain.  Loss of body fluids (dehydration).  Severe bleeding (hemorrhage).  Anxiety and stress.  Certain substances, including: ? Alcohol. ? Caffeine. ? Tobacco and nicotine products. ? Cold medicines. ? Illegal drugs.  Medical conditions including: ? Heart disease. ? An infection. ? An overactive thyroid (hyperthyroidism). ? A lack of red blood cells (anemia). What are the signs or symptoms? Symptoms of this condition include:  A feeling that the heart is beating quickly (palpitations).  Suddenly noticing your heartbeat (cardiac awareness).  Dizziness.  Tiredness (fatigue).  Shortness of breath.  Chest pain.  Nausea.  Fainting. How is this diagnosed? This condition is diagnosed with:  A physical exam.  Other tests, such as: ? Blood tests. ? An electrocardiogram (ECG). This test measures the electrical activity of the heart. ? Ambulatory cardiac monitor. This records your heartbeats for 24 hours or more. You may be referred to a heart specialist (cardiologist). How is this treated? Treatment for this condition depends on the cause or the underlying condition. Treatment may involve:  Treating the  underlying condition.  Taking new medicines or changing your current medicines as told by your health care provider.  Making changes to your diet or lifestyle. Follow these instructions at home: Lifestyle   Do not use any products that contain nicotine or tobacco, such as cigarettes and e-cigarettes. If you need help quitting, ask your health care provider.  Do not use illegal drugs, such as cocaine.  Learn relaxation methods to help you when you get stressed or anxious. These include deep breathing.  Avoid caffeine or other stimulants. Alcohol use   Do not drink alcohol if: ? Your health care provider tells you not to drink. ? You are pregnant, may be pregnant, or are planning to become pregnant.  If you drink alcohol, limit how much you have: ? 0-1 drink a day for women. ? 0-2 drinks a day for men.  Be aware of how much alcohol is in your drink. In the [place] one drink equals one typical bottle of beer (12 oz), one-half glass of wine (5 oz), or one shot of hard liquor (1 oz). General instructions  Drink enough fluids to keep your urine pale yellow.  Take over-the-counter and prescription medicines only as told by your health care provider.  Keep all follow-up visits as told by your health care provider. This is important. Contact a health care provider if you have:  A fever.  Vomiting or diarrhea that does not go away. Get help right away if you:  Have pain in your chest, upper arms, jaw, or neck.  Become weak or dizzy.  Feel faint.  Have palpitations that do  not go away. Summary  In sinus tachycardia, the heart beats more than 100 times a minute.  Sinus tachycardia may be harmless, or it may be a sign of a serious condition.  Treatment for this condition depends on the cause or the underlying condition.  Get help right away if you have pain in your chest, upper arms, jaw, or neck. This information is not intended to replace advice given to you by your  health care provider. Make sure you discuss any questions you have with your health care provider. Document Revised: 08/07/2017 Document Reviewed: 08/07/2017 Elsevier Patient Education  [employer].

## 2019-11-27 ENCOUNTER — Other Ambulatory Visit: Payer: Self-pay | Admitting: Osteopathic Medicine

## 2020-05-06 ENCOUNTER — Other Ambulatory Visit: Payer: Self-pay | Admitting: Osteopathic Medicine

## 2020-05-18 ENCOUNTER — Ambulatory Visit (INDEPENDENT_AMBULATORY_CARE_PROVIDER_SITE_OTHER): Payer: 59 | Admitting: Osteopathic Medicine

## 2020-05-18 ENCOUNTER — Encounter: Payer: Self-pay | Admitting: Osteopathic Medicine

## 2020-05-18 VITALS — BP 167/68 | HR 99 | Temp 98.2°F | Wt 150.0 lb

## 2020-05-18 DIAGNOSIS — M810 Age-related osteoporosis without current pathological fracture: Secondary | ICD-10-CM

## 2020-05-18 DIAGNOSIS — I10 Essential (primary) hypertension: Secondary | ICD-10-CM | POA: Diagnosis not present

## 2020-05-18 DIAGNOSIS — E039 Hypothyroidism, unspecified: Secondary | ICD-10-CM | POA: Diagnosis not present

## 2020-05-18 DIAGNOSIS — R002 Palpitations: Secondary | ICD-10-CM | POA: Diagnosis not present

## 2020-05-18 DIAGNOSIS — E785 Hyperlipidemia, unspecified: Secondary | ICD-10-CM

## 2020-05-18 DIAGNOSIS — R000 Tachycardia, unspecified: Secondary | ICD-10-CM

## 2020-05-18 MED ORDER — ALENDRONATE SODIUM 10 MG PO TABS
10.0000 mg | ORAL_TABLET | Freq: Every day | ORAL | 3 refills | Status: DC
Start: 2020-05-18 — End: 2020-05-20

## 2020-05-18 NOTE — Progress Notes (Signed)
HPI: Christina [name] is a 86 y.o. female who  has a past medical history of Hyperlipidemia and Hypertension.  she presents to [facility] today, 05/18/20,  for chief complaint of:  Needs labs and BP check       ASSESSMENT/PLAN: The primary encounter diagnosis was Hypothyroidism, unspecified type. Diagnoses of Hyperlipidemia, unspecified hyperlipidemia type, Essential hypertension, benign, Palpitations, Sinus tachycardia, and Age-related osteoporosis without current pathological fracture were also pertinent to this visit.  1. Hypothyroidism, unspecified type TSH today  2. Hyperlipidemia, unspecified hyperlipidemia type Lipids today  3. Essential hypertension, benign BP above goal, pt does not measure hmoe BP, will have her back for [place] to check accuracy of home monitor and will consider Rx adjustment from there if home numbers still above goal   4. Palpitations 5. Sinus tachycardia Rare, pt reports only when she feels anxious or worked up. Doesn't take the propranolol very often. Tachycardic but regular on CV exam, no c/o CP/SOB, syncope/presyncope. May consider add longer acting beta blocker for BP control rather than increasing Lisinopril   6. Age-related osteoporosis without current pathological fracture Continue Fosamax, check Vit D and Ca   Orders Placed This Encounter  Procedures  . CBC  . COMPLETE METABOLIC PANEL WITH GFR  . TSH  . VITAMIN D 25 Hydroxy (Vit-D Deficiency, Fractures)     Meds ordered this encounter  Medications  . alendronate (FOSAMAX) 10 MG tablet    Sig: Take 1 tablet (10 mg total) by mouth daily before breakfast.    Dispense:  90 tablet    Refill:  3    There are no Patient Instructions on file for this visit.    Follow-up plan: Return in about 1 week (around 05/25/2020) for NURSE VISIT - BP CHECK, BRING HOME BLOOD PRESSURE MACHINE(S)  .                                                 ################################################# ################################################# ################################################# #################################################    Current Meds  Medication Sig  . alendronate (FOSAMAX) 10 MG tablet Take 1 tablet (10 mg total) by mouth daily before breakfast.  . aspirin 81 MG tablet Take 81 mg by mouth daily. Two tablets daily  . cholecalciferol (VITAMIN D) 1000 units tablet Take 1,000 Units by mouth daily.  Marland Kitchen Hyaluronic Acid-Vitamin C (HYALURONIC ACID PO) Take by mouth.  . levothyroxine (SYNTHROID) 50 MCG tablet TAKE 1 TABLET DAILY  . lisinopril (ZESTRIL) 10 MG tablet TAKE 1 TABLET DAILY  . naproxen sodium (ALEVE) 220 MG tablet Take 1 tablet (220 mg total) by mouth 2 (two) times daily as needed.  . propranolol (INDERAL) 20 MG tablet Take 1 tablet (20 mg total) by mouth 3 (three) times daily as needed (palpitations).  . [DISCONTINUED] alendronate (FOSAMAX) 10 MG tablet Take 1 tablet (10 mg total) by mouth daily before breakfast.  . [DISCONTINUED] Aspirin Buf,CaCarb-MgCarb-MgO, 81 MG TABS Take by mouth.  . [DISCONTINUED] Vitamin D, Ergocalciferol, (DRISDOL) 1.25 MG (50000 UT) CAPS capsule Take 1 capsule (50,000 Units total) by mouth every 7 (seven) days. Take for 8 total doses(weeks)    Allergies  Allergen Reactions  . Sulfur Hives  . Sulfa Antibiotics Rash, Other (See Comments) and Hives    unknown        Review of Systems: Pertinent (+) and (-) ROS in HPI as  above   Exam:  BP (!) 167/68 (BP Location: Left Arm, Patient Position: Sitting, Cuff Size: Normal)   Pulse 99   Temp 98.2 F (36.8 C) (Oral)   Wt 150 lb 0.6 oz (68.1 kg)   BMI 25.75 kg/m   Constitutional: VS see above. General Appearance: alert, well-developed, well-nourished, NAD  Neck: No masses, trachea midline.   Respiratory: Normal  respiratory effort. no wheeze, no rhonchi, no rales  Cardiovascular: S1/S2 normal, no murmur, no rub/gallop auscultated. Reg rhythm but mild tachycardia about 110.   Musculoskeletal: Gait normal. Symmetric and independent movement of all extremities  Abdominal: non-tender, non-distended, no appreciable organomegaly, neg Murphy's, BS [id number]  Neurological: Normal balance/coordination. No tremor.  Skin: warm, dry, intact.   Psychiatric: Normal judgment/insight. Normal mood and affect. Oriented x3.       Visit summary with medication list and pertinent instructions was printed for patient to review, patient was advised to alert [place] if any updates are needed. All questions at time of visit were answered - patient instructed to contact office with any additional concerns. ER/RTC precautions were reviewed with the patient and understanding verbalized.        Please note: voice recognition software was used to produce this document, and typos may escape review. Please contact Dr. Lyn Hollingshead for any needed clarifications.    Follow up plan: Return in about 1 week (around 05/25/2020) for NURSE VISIT - BP CHECK, BRING HOME BLOOD PRESSURE MACHINE(S) .

## 2020-05-19 LAB — COMPLETE METABOLIC PANEL WITH GFR
AG Ratio: 1.8 (calc) (ref 1.0–2.5)
ALT: 11 U/L (ref 6–29)
AST: 14 U/L (ref 10–35)
Albumin: 4.4 g/dL (ref 3.6–5.1)
Alkaline phosphatase (APISO): 78 U/L (ref 37–153)
BUN: 16 mg/dL (ref 7–25)
CO2: 28 mmol/L (ref 20–32)
Calcium: 11.2 mg/dL — ABNORMAL HIGH (ref 8.6–10.4)
Chloride: 104 mmol/L (ref 98–110)
Creat: 0.75 mg/dL (ref 0.60–0.88)
GFR, Est African American: 80 mL/min/{1.73_m2} (ref >=60)
GFR, Est Non African American: 69 mL/min/{1.73_m2} (ref >=60)
Globulin: 2.5 g/dL (calc) (ref 1.9–3.7)
Glucose, Bld: 91 mg/dL (ref 65–139)
Potassium: 4.8 mmol/L (ref 3.5–5.3)
Sodium: 139 mmol/L (ref 135–146)
Total Bilirubin: 0.4 mg/dL (ref 0.2–1.2)
Total Protein: 6.9 g/dL (ref 6.1–8.1)

## 2020-05-19 LAB — CBC
HCT: 38.2 % (ref 35.0–45.0)
Hemoglobin: 13.1 g/dL (ref 11.7–15.5)
MCH: 30.7 pg (ref 27.0–33.0)
MCHC: 34.3 g/dL (ref 32.0–36.0)
MCV: 89.5 fL (ref 80.0–100.0)
MPV: 10.4 fL (ref 7.5–12.5)
Platelets: 231 10*3/uL (ref 140–400)
RBC: 4.27 10*6/uL (ref 3.80–5.10)
RDW: 13 % (ref 11.0–15.0)
WBC: 5.4 10*3/uL (ref 3.8–10.8)

## 2020-05-19 LAB — TSH: TSH: 1.31 mIU/L (ref 0.40–4.50)

## 2020-05-19 LAB — VITAMIN D 25 HYDROXY (VIT D DEFICIENCY, FRACTURES): Vit D, 25-Hydroxy: 23 ng/mL — ABNORMAL LOW (ref 30–100)

## 2020-05-20 ENCOUNTER — Other Ambulatory Visit: Payer: Self-pay | Admitting: Osteopathic Medicine

## 2020-05-25 ENCOUNTER — Other Ambulatory Visit: Payer: Self-pay

## 2020-05-25 ENCOUNTER — Ambulatory Visit (INDEPENDENT_AMBULATORY_CARE_PROVIDER_SITE_OTHER): Payer: 59 | Admitting: Osteopathic Medicine

## 2020-05-25 VITALS — BP 141/48 | HR 97

## 2020-05-25 DIAGNOSIS — I10 Essential (primary) hypertension: Secondary | ICD-10-CM

## 2020-05-25 MED ORDER — ALENDRONATE SODIUM 10 MG PO TABS
10.0000 mg | ORAL_TABLET | Freq: Every day | ORAL | 0 refills | Status: DC
Start: 2020-05-25 — End: 2020-07-11

## 2020-05-25 NOTE — Progress Notes (Signed)
   Subjective:    Patient ID: Christina [name], female    DOB: 10/02/1924, 86 y.o.   MRN: [id number]  HPI Patient is here for blood pressure check. Denies trouble sleeping, palpitations, or medication problems.   Brings home blood pressure logs with the following readings: 11-19: BP: 137/52 P: 87 then rechecked for BP: 166/58 P: 94 11-20: BP: 145/50 P: 95 11-23: BP: 140/51 P: 91 then rechecked for BP: 149/55 P: 85  Review of Systems     Objective:   Physical Exam        Assessment & Plan:  First reading on office machine was 159/56, pulse was elevated at 101. Home reading directly after in same arm was 166/57 pulse 102.  Blood pressure checked after patient rested for 10 minutes came down to 141/48 pulse 97.   Patient confirms she is taking lisinopril 10 mg QD and has taken medication today.   Advised her we would call with providers recommendations for mediation changes and for follow up.

## 2020-05-25 NOTE — Addendum Note (Signed)
Addended by: Deirdre Pippins on: 05/25/2020 11:02 AM   Modules accepted: Orders

## 2020-05-25 NOTE — Progress Notes (Signed)
I think home BP monitor is reading ok, can continue to use this.   If home BP mostly at goal of around 140/90 or less, then no need to change Rx   If home BP consistnetly higher than 140/90 would increase lisinopril to 20 mg daily

## 2020-05-25 NOTE — Progress Notes (Signed)
Patient advised. Will contact office if she starts having elevated pressures.

## 2020-06-23 ENCOUNTER — Other Ambulatory Visit: Payer: Self-pay

## 2020-06-23 ENCOUNTER — Ambulatory Visit (INDEPENDENT_AMBULATORY_CARE_PROVIDER_SITE_OTHER): Payer: 59 | Admitting: Sports Medicine

## 2020-06-23 DIAGNOSIS — M5416 Radiculopathy, lumbar region: Secondary | ICD-10-CM | POA: Insufficient documentation

## 2020-06-23 NOTE — Progress Notes (Signed)
° ° °  Procedures performed today:    None.  Independent interpretation of notes and tests performed by another provider:   None.  Brief History, Exam, Impression, and Recommendations:    Left lumbar radiculitis This is an exquisitely pleasant 86 year old female, yesterday she had some pain radiating from deep in her left buttock, down the posterior lateral thigh, posterior lateral lower leg almost to the foot. It went away as quickly as it developed. No red flag symptoms, no tenderness over the greater trochanter, hip exam is for the most part unremarkable. This is likely radicular and related to lumbar spinal stenosis, we will take a minimalistic approach here, core conditioning exercises given to be done on a daily basis. Return to see me on an as-needed basis.    ___________________________________________ [name]. Benjamin Stain, M.D., ABFM., CAQSM. Primary Care and Sports Medicine [facility]  Adjunct Instructor of Family Medicine  [employer]

## 2020-06-23 NOTE — Assessment & Plan Note (Signed)
This is an exquisitely pleasant 86 year old female, yesterday she had some pain radiating from deep in her left buttock, down the posterior lateral thigh, posterior lateral lower leg almost to the foot. It went away as quickly as it developed. No red flag symptoms, no tenderness over the greater trochanter, hip exam is for the most part unremarkable. This is likely radicular and related to lumbar spinal stenosis, we will take a minimalistic approach here, core conditioning exercises given to be done on a daily basis. Return to see me on an as-needed basis.

## 2020-06-27 ENCOUNTER — Encounter: Payer: Self-pay | Admitting: Family Medicine

## 2020-06-27 ENCOUNTER — Emergency Department
Admission: EM | Admit: 2020-06-27 | Discharge: 2020-06-27 | Disposition: A | Discharge status: Home / Self Care | Payer: 59 | Source: Home / Self Care

## 2020-06-27 ENCOUNTER — Other Ambulatory Visit: Payer: Self-pay

## 2020-06-27 DIAGNOSIS — I483 Typical atrial flutter: Secondary | ICD-10-CM | POA: Diagnosis not present

## 2020-06-27 LAB — POCT URINALYSIS DIP (MANUAL ENTRY)
Bilirubin, UA: NEGATIVE
Blood, UA: NEGATIVE
Glucose, UA: NEGATIVE mg/dL
Ketones, POC UA: NEGATIVE mg/dL
Leukocytes, UA: NEGATIVE
Nitrite, UA: NEGATIVE
Protein Ur, POC: NEGATIVE mg/dL
Spec Grav, UA: 1.015 (ref 1.010–1.025)
Urobilinogen, UA: 0.2 E.U./dL
pH, UA: 7 (ref 5.0–8.0)

## 2020-06-27 NOTE — ED Provider Notes (Signed)
Christina [name] [facility]    CSN: [id number] Arrival date & time: 06/27/20  0849      History   Chief Complaint Chief Complaint  Patient presents with  . Dizziness    HPI Christina [name] is a 86 y.o. female.   Is a 86 year old woman who is an established patient here at [facility] urgent care and is complaining of dizziness.  Patient is currently on Inderal 3 times a day for palpitations.  Patient presents to Urgent Care with complaints of dizziness since the day before yesterday. Patient reports she feels like her heart races and then the lightheadedness sets in. Pt thinks she has had tachycardia in the past, but that was "a while ago".   Past medical history is complicated with a history of hypertension, hyperparathyroidism, PACs, and anxiety.  She is also on Fosamax so I assume that she has osteoporosis.       Past Medical History:  Diagnosis Date  . Hyperlipidemia   . Hypertension     Patient Active Problem List   Diagnosis Date Noted  . Left lumbar radiculitis 06/23/2020  . Constipation 09/17/2016  . Annual physical exam 03/29/2016  . Primary osteoarthritis of both knees 01/17/2016  . Hypothyroid 06/07/2015  . Osteoporosis 04/17/2015  . Hyperparathyroidism (HCC) 02/07/2015  . Hypercalcemia 01/27/2015  . Essential hypertension, benign 04/28/2013  . Hyperlipidemia 04/28/2013  . Premature atrial contraction 02/28/2012  . Palpitations 02/05/2012  . Depression 04/16/2011  . Gastroesophageal reflux disease 04/16/2011  . Posttraumatic stress disorder 04/16/2011  . Anxiety 04/16/2011  . Restless legs syndrome (RLS) 04/16/2011    History reviewed. No pertinent surgical history.  OB History   No obstetric history on file.      Home Medications    Prior to Admission medications   Medication Sig Start Date End Date Taking? Authorizing Provider  alendronate (FOSAMAX) 10 MG tablet Take 1 tablet (10 mg total) by mouth daily before breakfast. 05/25/20    Sunnie Nielsen, DO  aspirin 81 MG tablet Take 81 mg by mouth daily. Two tablets daily    [provider]  cholecalciferol (VITAMIN D) 1000 units tablet Take 1,000 Units by mouth daily.    [provider]  Hyaluronic Acid-Vitamin C (HYALURONIC ACID PO) Take by mouth.    [provider]  levothyroxine (SYNTHROID) 50 MCG tablet TAKE 1 TABLET DAILY 05/06/20   Sunnie Nielsen, DO  lisinopril (ZESTRIL) 10 MG tablet TAKE 1 TABLET DAILY 05/06/20   Sunnie Nielsen, DO  naproxen sodium (ALEVE) 220 MG tablet Take 1 tablet (220 mg total) by mouth 2 (two) times daily as needed. 04/13/19   Sunnie Nielsen, DO  propranolol (INDERAL) 20 MG tablet Take 1 tablet (20 mg total) by mouth 3 (three) times daily as needed (palpitations). 09/02/19   Sunnie Nielsen, DO    Family History Family History  Problem Relation Age of Onset  . Healthy Mother   . Healthy Father     Social History Social History   Tobacco Use  . Smoking status: Never Smoker  . Smokeless tobacco: Never Used  Substance Use Topics  . Alcohol use: No  . Drug use: No     Allergies   Sulfur and Sulfa antibiotics   Review of Systems Review of Systems  Constitutional: Negative.   Respiratory: Negative.   Cardiovascular: Positive for palpitations.  Neurological: Positive for light-headedness.     Physical Exam Triage Vital Signs ED Triage Vitals [06/27/20 0914]  Enc Vitals Group     BP  Pulse      Resp      Temp      Temp src      SpO2      Weight      Height      Head Circumference      Peak Flow      Pain Score 0     Pain Loc      Pain Edu?      Excl. in GC?    No data found.  Updated Vital Signs BP (!) 170/83 (BP Location: Left Arm)   Pulse (!) 147   Temp 98.4 F (36.9 C) (Oral)   Resp 15   SpO2 97%    Physical Exam Vitals and nursing note reviewed.  Constitutional:      General: She is not in acute distress.    Appearance: Normal appearance. She is normal  weight.  HENT:     Head: Normocephalic.     Mouth/Throat:     Mouth: Mucous membranes are moist.  Eyes:     Conjunctiva/sclera: Conjunctivae normal.  Cardiovascular:     Rate and Rhythm: Regular rhythm. Tachycardia present.     Heart sounds: Normal heart sounds.  Pulmonary:     Effort: Pulmonary effort is normal.     Breath sounds: Normal breath sounds.  Musculoskeletal:        General: Normal range of motion.     Cervical back: Normal range of motion and neck supple.  Skin:    General: Skin is warm and dry.  Neurological:     General: No focal deficit present.     Mental Status: She is alert.     Gait: Gait abnormal.  Psychiatric:        Mood and Affect: Mood normal.        Behavior: Behavior normal.        Thought Content: Thought content normal.      UC Treatments / Results  Labs (all labs ordered are listed, but only abnormal results are displayed) Labs Reviewed  POCT URINALYSIS DIP (MANUAL ENTRY)    EKG   Radiology No results found.  Procedures Procedures (including critical care time)  Medications Ordered in UC Medications - No data to display  Initial Impression / Assessment and Plan / UC Course  I have reviewed the triage vital signs and the nursing notes.  Pertinent labs & imaging results that were available during my care of the patient were reviewed by me and considered in my medical decision making (see chart for details).     Final Clinical Impressions(s) / UC Diagnoses   Final diagnoses:  Typical atrial flutter [facility])     Discharge Instructions     You have atrial flutter which is a rapid heart rate that is regular.  This needs to be evaluated in the emergency room to see if there is a problem with potassium or calcium.  This can be corrected either with medication or cardioversion (electricity).  But this needs to be done under the supervision of a cardiology team.    ED Prescriptions    None     I have reviewed the PDMP during  this encounter.   Elvina Sidle, MD 06/27/20 [id number]

## 2020-06-27 NOTE — ED Notes (Signed)
Patient is being discharged from the Urgent Care and sent to the Emergency Department via POV . Per Dr. Milus Glazier, patient is in need of higher level of care due to symptomatic atrial flutter, HR around 150. Patient is aware and verbalizes understanding of plan of care. Report called to [facility] ED. Vitals:   06/27/20 0916  BP: (!) 170/83  Pulse: (!) 147  Resp: 15  Temp: 98.4 F (36.9 C)  SpO2: 97%

## 2020-06-27 NOTE — Discharge Instructions (Addendum)
You have atrial flutter which is a rapid heart rate that is regular.  This needs to be evaluated in the emergency room to see if there is a problem with potassium or calcium.  This can be corrected either with medication or cardioversion (electricity).  But this needs to be done under the supervision of a cardiology team.

## 2020-06-27 NOTE — ED Triage Notes (Signed)
Patient presents to Urgent Care with complaints of dizziness since the day before yesterday. Patient reports she feels like her heart races and then the lightheadedness sets in. Pt thinks she has had tachycardia in the past, but that was "a while ago".

## 2020-07-11 ENCOUNTER — Ambulatory Visit (INDEPENDENT_AMBULATORY_CARE_PROVIDER_SITE_OTHER): Payer: 59 | Admitting: Osteopathic Medicine

## 2020-07-11 ENCOUNTER — Encounter: Payer: Self-pay | Admitting: Osteopathic Medicine

## 2020-07-11 ENCOUNTER — Other Ambulatory Visit: Payer: Self-pay

## 2020-07-11 VITALS — BP 146/69 | HR 89 | Temp 98.1°F | Wt 147.0 lb

## 2020-07-11 DIAGNOSIS — E039 Hypothyroidism, unspecified: Secondary | ICD-10-CM | POA: Diagnosis not present

## 2020-07-11 DIAGNOSIS — Z8679 Personal history of other diseases of the circulatory system: Secondary | ICD-10-CM | POA: Diagnosis not present

## 2020-07-11 DIAGNOSIS — I10 Essential (primary) hypertension: Secondary | ICD-10-CM | POA: Diagnosis not present

## 2020-07-11 MED ORDER — LEVOTHYROXINE SODIUM 50 MCG PO TABS: 50.0000 ug | ORAL_TABLET | Freq: Every day | ORAL | 3 refills | Status: DC

## 2020-07-11 MED ORDER — ALENDRONATE SODIUM 10 MG PO TABS: 10.0000 mg | ORAL_TABLET | Freq: Every day | ORAL | 3 refills | Status: DC

## 2020-07-11 MED ORDER — METOPROLOL SUCCINATE ER 25 MG PO TB24: 25.0000 mg | ORAL_TABLET | Freq: Every day | ORAL | 3 refills | Status: DC

## 2020-07-11 MED ORDER — LISINOPRIL 10 MG PO TABS: 10.0000 mg | ORAL_TABLET | Freq: Every day | ORAL | 3 refills | Status: DC

## 2020-07-11 NOTE — Patient Instructions (Addendum)
Plan:  For Atrial Flutter - see below for more information on this condition.  This may have been a one-time thing, BUT... I'd like to get a second opinion from a cardiologist regarding:  Do we need more testing? (heart monitor, ultrasound, stress test, etc)  Do you need to be on blood thinners (may depend on testing)?   Do they recommend additional procedures or medications to treat this, or can we just monitor?   Levothyroxine and Fosamax together 30 mins before breakfast.  Metoprolol and Lisinopril in evening/dinner/bedtime - doesn't matter  Will recheck labs now and in 6 weeks for thyroid (abnormal thyroid levels can trigger Atrial Flutter, we need to recheck calcium, too!)          Atrial Flutter  Atrial flutter is a type of abnormal heart rhythm (arrhythmia). The heart has an electrical system that tells it how to beat. In atrial flutter, the signals move rapidly in the top chambers of the heart (the atria). This makes your heart beat very fast. Atrial flutter can come and go, or it can be permanent. The goal of treatment is to prevent blood clots from forming, control your heart rate, or restore your heartbeat to a normal rhythm. If this condition is not treated, it can cause serious problems, such as a weakened heart muscle (cardiomyopathy) or a stroke.  What are the causes? This condition is often caused by conditions that damage the heart's electrical system. These include:  Heart conditions and heart surgery. These include heart attacks and open-heart surgery.  Lung problems, such as COPD or a blood clot in the lung (pulmonary embolism, or PE).  Poorly controlled high blood pressure (hypertension).  Overactive thyroid (hyperthyroidism).  Diabetes. In some cases, the cause of this condition is not known.  What increases the risk? You are more likely to develop this condition if:  You are an elderly adult.  You are a man.  You are overweight (obese).  You  have obstructive sleep apnea.  You have a family history of atrial flutter.  You have diabetes.  You drink a lot of alcohol, especially binge drinking.  You use drugs, including cannabis.  You smoke.  What are the signs or symptoms? Symptoms of this condition include:  A feeling that your heart is pounding or racing (palpitations).  Shortness of breath.  Chest pain.  Feeling dizzy or light-headed.  Fainting.  Low blood pressure (hypotension).  Fatigue.  Tiring easily during exercise or activity. In some cases, there are no symptoms.  How is this diagnosed? This condition may be diagnosed with:  An electrocardiogram (ECG) to check electrical signals of the heart.  An ambulatory cardiac monitor to record your heart's activity for a few days.  An echocardiogram to create pictures of your heart.  A transesophageal echocardiogram (TEE) to create even better pictures of your heart.  A stress test to check your blood supply while you exercise.  Imaging tests, such as a CT scan or chest X-ray.  Blood tests.  How is this treated? Treatment depends on underlying conditions and how you feel when you experience atrial flutter. This condition may be treated with:  Medicines to prevent blood clots or to treat heart rate or heart rhythm problems.  Electrical cardioversion to reset the heart's rhythm.  Ablation to remove the heart tissue that sends abnormal signals.  Left atrial appendage closure to seal the area where blood clots can form. In some cases, underlying conditions will be treated.  Follow these  instructions at home: Medicines  Take over-the-counter and prescription medicines only as told by your health care provider.  Do not take any new medicines without talking to your health care provider.  If you are taking blood thinners: ? Talk with your health care provider before you take any medicines that contain aspirin or NSAIDs, such as ibuprofen. These  medicines increase your risk for dangerous bleeding. ? Take your medicine exactly as told, at the same time every day. ? Avoid activities that could cause injury or bruising, and follow instructions about how to prevent falls. ? Wear a medical alert bracelet or carry a card that lists what medicines you take. Lifestyle  Eat heart-healthy foods. Talk with a dietitian to make an eating plan that is right for you.  Do not use any products that contain nicotine or tobacco, such as cigarettes, e-cigarettes, and chewing tobacco. If you need help quitting, ask your health care provider.  Do not drink alcohol.  Do not use drugs, including cannabis.  Lose weight if you are overweight or obese.  Exercise regularly as instructed by your health care provider. General instructions  Do not use diet pills unless your health care provider approves. Diet pills may make heart problems worse.  If you have obstructive sleep apnea, manage your condition as told by your health care provider.  Keep all follow-up visits as told by your health care provider. This is important. Contact a health care provider if you:  Notice a change in the rate, rhythm, or strength of your heartbeat.  Are taking a blood thinner and you notice more bruising.  Have a sudden change in weight.  Tire more easily when you exercise or do heavy work. Get help right away if you have:  Pain or pressure in your chest.  Shortness of breath.  Fainting.  Increasing sweating with no known cause.  Side effects of blood thinners, such as blood in your vomit, stool, or urine, or bleeding that cannot stop.  Any symptoms of a stroke. "BE FAST" is an easy way to remember the main warning signs of a stroke: ? B - Balance. Signs are dizziness, sudden trouble walking, or loss of balance. ? E - Eyes. Signs are trouble seeing or a sudden change in vision. ? F - Face. Signs are sudden weakness or numbness of the face, or the face or  eyelid drooping on one side. ? A - Arms. Signs are weakness or numbness in an arm. This happens suddenly and usually on one side of the body. ? S - Speech. Signs are sudden trouble speaking, slurred speech, or trouble understanding what people say. ? T - Time. Time to call emergency services. Write down what time symptoms started.  Other signs of a stroke, such as: ? A sudden, severe headache with no known cause. ? Nausea or vomiting. ? Seizure.  These symptoms may represent a serious problem that is an emergency. Do not wait to see if the symptoms will go away. Get medical help right away. Call your local emergency services (911 in the [place] Do not drive yourself to the hospital. Summary  Atrial flutter is an abnormal heart rhythm that can give you symptoms of palpitations, shortness of breath, or fatigue.  Atrial flutter is often treated with medicines to keep your heart in a normal rhythm and to prevent a stroke.  Get help right away if you cannot catch your breath, or have chest pain or pressure.  Get help right away if  you have signs or symptoms of a stroke.  This information is not intended to replace advice given to you by your health care provider. Make sure you discuss any questions you have with your health care provider. Document Revised: 12/10/2018 Document Reviewed: 12/10/2018 Elsevier Patient Education  2021 [employer].

## 2020-07-11 NOTE — Progress Notes (Signed)
Christina [name] is a 87 y.o. female who presents to  [place] at [facility]  today, 07/11/20, seeking care for the following: HFU, Aflutter  Pt feeling well today, no additional episodes of lightheadedness, no CP/SOB.   Records reviewed:   UC to ER 06/27/20 for cc palpitations dx Aflutter at [facility]. Started on metoprolol XR 25 mg daily in addition to usual Rx (including propranolol). At ER, given lopressor IV and converted to sinus.Tropes flat but slight elevation, EKG was sinus rhythm at discharge I cannot review tracings for EKG or telemetry through [place].   Calcium high in ED, was high on checkup here about 2 mos ago but never repeated w/ workup, see lab result notes, pt was instructed to return to lab.   TSH WNL last check 05/2020, not checked in ED.      ASSESSMENT & PLAN with other pertinent findings:  The primary encounter diagnosis was History of atrial flutter. Diagnoses of Essential hypertension, benign, Hypothyroidism, unspecified type, and Hypercalcemia were also pertinent to this visit.   Rate controlled and EKG shows NSR today. No symptoms.   CHADS2VASC would warrant anticoagulation but I think reasonable given age and fall risk to assess if Aflutter is recurrent/paroxysmal, get cardiology second opinion. P[place]net and daughter would like to hold off on anticoagulation for now.NSR today, I think CHADS2VASC probably overestimates risk but would appreciate cardiology input.   Pt would like to defer labs until 6 weeks check, she's altering how she takes synthroid anyway, this is probably reasonable given no additional symptoms but pt was cautioned we might be missing somehting if we don't et labs today for TSH, Calcium f/u    No results found for this or any previous visit (from the past 24 hour(s)).   Patient Instructions  Plan:  For Atrial Flutter - see below for more information on this condition.  This may have been a  one-time thing, BUT... I'd like to get a second opinion from a cardiologist regarding:  Do we need more testing? (heart monitor, ultrasound, stress test, etc)  Do you need to be on blood thinners (may depend on testing)?   Do they recommend additional procedures or medications to treat this, or can we just monitor?   Levothyroxine and Fosamax together 30 mins before breakfast.  Metoprolol and Lisinopril in evening/dinner/bedtime - doesn't matter  Will recheck labs now and in 6 weeks for thyroid (abnormal thyroid levels can trigger Atrial Flutter, we need to recheck calcium, too!)          Atrial Flutter  Atrial flutter is a type of abnormal heart rhythm (arrhythmia). The heart has an electrical system that tells it how to beat. In atrial flutter, the signals move rapidly in the top chambers of the heart (the atria). This makes your heart beat very fast. Atrial flutter can come and go, or it can be permanent. The goal of treatment is to prevent blood clots from forming, control your heart rate, or restore your heartbeat to a normal rhythm. If this condition is not treated, it can cause serious problems, such as a weakened heart muscle (cardiomyopathy) or a stroke.  What are the causes? This condition is often caused by conditions that damage the heart's electrical system. These include:  Heart conditions and heart surgery. These include heart attacks and open-heart surgery.  Lung problems, such as COPD or a blood clot in the lung (pulmonary embolism, or PE).  Poorly controlled high blood pressure (  hypertension).  Overactive thyroid (hyperthyroidism).  Diabetes. In some cases, the cause of this condition is not known.  What increases the risk? You are more likely to develop this condition if:  You are an elderly adult.  You are a man.  You are overweight (obese).  You have obstructive sleep apnea.  You have a family history of atrial flutter.  You have  diabetes.  You drink a lot of alcohol, especially binge drinking.  You use drugs, including cannabis.  You smoke.  What are the signs or symptoms? Symptoms of this condition include:  A feeling that your heart is pounding or racing (palpitations).  Shortness of breath.  Chest pain.  Feeling dizzy or light-headed.  Fainting.  Low blood pressure (hypotension).  Fatigue.  Tiring easily during exercise or activity. In some cases, there are no symptoms.  How is this diagnosed? This condition may be diagnosed with:  An electrocardiogram (ECG) to check electrical signals of the heart.  An ambulatory cardiac monitor to record your heart's activity for a few days.  An echocardiogram to create pictures of your heart.  A transesophageal echocardiogram (TEE) to create even better pictures of your heart.  A stress test to check your blood supply while you exercise.  Imaging tests, such as a CT scan or chest X-ray.  Blood tests.  How is this treated? Treatment depends on underlying conditions and how you feel when you experience atrial flutter. This condition may be treated with:  Medicines to prevent blood clots or to treat heart rate or heart rhythm problems.  Electrical cardioversion to reset the heart's rhythm.  Ablation to remove the heart tissue that sends abnormal signals.  Left atrial appendage closure to seal the area where blood clots can form. In some cases, underlying conditions will be treated.  Follow these instructions at home: Medicines  Take over-the-counter and prescription medicines only as told by your health care provider.  Do not take any new medicines without talking to your health care provider.  If you are taking blood thinners: ? Talk with your health care provider before you take any medicines that contain aspirin or NSAIDs, such as ibuprofen. These medicines increase your risk for dangerous bleeding. ? Take your medicine exactly as told,  at the same time every day. ? Avoid activities that could cause injury or bruising, and follow instructions about how to prevent falls. ? Wear a medical alert bracelet or carry a card that lists what medicines you take. Lifestyle  Eat heart-healthy foods. Talk with a dietitian to make an eating plan that is right for you.  Do not use any products that contain nicotine or tobacco, such as cigarettes, e-cigarettes, and chewing tobacco. If you need help quitting, ask your health care provider.  Do not drink alcohol.  Do not use drugs, including cannabis.  Lose weight if you are overweight or obese.  Exercise regularly as instructed by your health care provider. General instructions  Do not use diet pills unless your health care provider approves. Diet pills may make heart problems worse.  If you have obstructive sleep apnea, manage your condition as told by your health care provider.  Keep all follow-up visits as told by your health care provider. This is important. Contact a health care provider if you:  Notice a change in the rate, rhythm, or strength of your heartbeat.  Are taking a blood thinner and you notice more bruising.  Have a sudden change in weight.  Tire more easily  when you exercise or do heavy work. Get help right away if you have:  Pain or pressure in your chest.  Shortness of breath.  Fainting.  Increasing sweating with no known cause.  Side effects of blood thinners, such as blood in your vomit, stool, or urine, or bleeding that cannot stop.  Any symptoms of a stroke. "BE FAST" is an easy way to remember the main warning signs of a stroke: ? B - Balance. Signs are dizziness, sudden trouble walking, or loss of balance. ? E - Eyes. Signs are trouble seeing or a sudden change in vision. ? F - Face. Signs are sudden weakness or numbness of the face, or the face or eyelid drooping on one side. ? A - Arms. Signs are weakness or numbness in an arm. This happens  suddenly and usually on one side of the body. ? S - Speech. Signs are sudden trouble speaking, slurred speech, or trouble understanding what people say. ? T - Time. Time to call emergency services. Write down what time symptoms started.  Other signs of a stroke, such as: ? A sudden, severe headache with no known cause. ? Nausea or vomiting. ? Seizure.  These symptoms may represent a serious problem that is an emergency. Do not wait to see if the symptoms will go away. Get medical help right away. Call your local emergency services (911 in the [place] Do not drive yourself to the hospital. Summary  Atrial flutter is an abnormal heart rhythm that can give you symptoms of palpitations, shortness of breath, or fatigue.  Atrial flutter is often treated with medicines to keep your heart in a normal rhythm and to prevent a stroke.  Get help right away if you cannot catch your breath, or have chest pain or pressure.  Get help right away if you have signs or symptoms of a stroke.  This information is not intended to replace advice given to you by your health care provider. Make sure you discuss any questions you have with your health care provider. Document Revised: 12/10/2018 Document Reviewed: 12/10/2018 Elsevier Patient Education  2021 [employer].    Orders Placed This Encounter  Procedures  . CMP14+EGFR  . TSH  . Parathyroid hormone, intact (no Ca)  . Calcium, ionized  . EKG 12-Lead    Meds ordered this encounter  Medications  . metoprolol succinate (TOPROL-XL) 25 MG 24 hr tablet    Sig: Take 1 tablet (25 mg total) by mouth daily.    Dispense:  90 tablet    Refill:  3  . levothyroxine (SYNTHROID) 50 MCG tablet    Sig: Take 1 tablet (50 mcg total) by mouth daily before breakfast.    Dispense:  90 tablet    Refill:  3  . lisinopril (ZESTRIL) 10 MG tablet    Sig: Take 1 tablet (10 mg total) by mouth daily.    Dispense:  90 tablet    Refill:  3  . alendronate (FOSAMAX) 10 MG  tablet    Sig: Take 1 tablet (10 mg total) by mouth daily before breakfast.    Dispense:  90 tablet    Refill:  3       Follow-up instructions: Return for RECHECK PENDING LAB RESULTS AND CARDIOLOGY RECOMMENDATIONS / IF WORSE OR CHANGE.  BP (!) 146/69 (BP Location: Left Arm, Patient Position: Sitting, Cuff Size: Normal)   Pulse 89   Temp 98.1 F (36.7 C) (Oral)   Wt 147 lb (66.7 kg)   BMI 25.23 kg/m   Current Meds  Medication Sig  . metoprolol succinate (TOPROL-XL) 25 MG 24 hr tablet Take 1 tablet (25 mg total) by mouth daily.  . [DISCONTINUED] alendronate (FOSAMAX) 10 MG tablet Take 1 tablet (10 mg total) by mouth daily before breakfast.  . [DISCONTINUED] aspirin 81 MG tablet Take 81 mg by mouth daily. Two tablets daily  . [DISCONTINUED] levothyroxine (SYNTHROID) 50 MCG tablet TAKE 1 TABLET DAILY  . [DISCONTINUED] lisinopril (ZESTRIL) 10 MG tablet TAKE 1 TABLET DAILY  . [DISCONTINUED] naproxen sodium (ALEVE) 220 MG tablet Take 1 tablet (220 mg total) by mouth 2 (two) times daily as needed.  . [DISCONTINUED] propranolol (INDERAL) 20 MG tablet Take 1 tablet (20 mg total) by mouth 3 (three) times daily as needed (palpitations).    No results found for this or any previous visit (from the past 72 hour(s)).  No results found.     All questions at time of visit were answered - patient instructed to contact office with any additional concerns or updates.  ER/RTC precautions were reviewed with the patient as applicable.   Please note: voice recognition software was used to produce this document, and typos may escape review. Please contact Dr. Sheppard Coil for any needed clarifications.   Total time spent in this in-person encounter was 40 minutes performing my duty and my job as a physician: record review, history-taking and assessment of patient, counseling patient and ensuring understanding of plan,  coordinating care which may include but is not limited to further testing or referral. Any modifiers needed may be added by billing department as necessary if I have lapsed in doing so.

## 2020-08-04 ENCOUNTER — Other Ambulatory Visit: Payer: Self-pay | Admitting: Osteopathic Medicine

## 2020-09-02 NOTE — Progress Notes (Signed)
Referring-Natalie Alexander, DO Reason for referral-atrial flutter  HPI: 87 year old female for evaluation of atrial flutter at request of Sunnie Nielsen, DO.  Seen previously for palpitations at [facility]  Echocardiogram in 2013 showed normal LV function and mild mitral regurgitation.  Monitor at that time showed sinus rhythm with PACs.  Patient seen in the [facility] emergency room December 2021 with palpitations.  Electrocardiogram June 27, 2020 showed typical atrial flutter.  Patient converted to sinus rhythm with metoprolol.  Laboratories December 2021 showed hemoglobin 14, TSH 2.07, creatinine 0.79.  Cardiology now asked to evaluate.  Patient denies dyspnea on exertion, orthopnea, PND, pedal edema, chest pain or syncope.  She has had intermittent palpitations for years.  There is occasional associated dizziness.  Since her episode in December she has had short episodes of palpitations twice but otherwise feels well and feels as though her symptoms have improved.  Current Outpatient Medications  Medication Sig Dispense Refill   alendronate (FOSAMAX) 10 MG tablet Take 1 tablet (10 mg total) by mouth daily before breakfast. 90 tablet 3   levothyroxine (SYNTHROID) 50 MCG tablet TAKE 1 TABLET DAILY (LAST REFILL. NEED LABS AND OFFICE VISIT WITH PRIMARY CARE PHYSICIAN) 90 tablet 3   lisinopril (ZESTRIL) 10 MG tablet TAKE 1 TABLET DAILY (LAST REFILL. NEEDS LABS AND OFFICE VISIT WITH PRIMARY CARE PHYSICIAN) 90 tablet 3   metoprolol succinate (TOPROL-XL) 25 MG 24 hr tablet Take 1 tablet (25 mg total) by mouth daily. 90 tablet 3   No current facility-administered medications for this visit.    Allergies  Allergen Reactions   Elemental Sulfur Hives   Sulfa Antibiotics Rash, Other (See Comments) and Hives    unknown      Past Medical History:  Diagnosis Date   Hyperlipidemia    Hypertension    Hypothyroid     Past Surgical History:  Procedure Laterality Date   APPENDECTOMY      TONSILLECTOMY      Social History   Socioeconomic History   Marital status: Widowed    Spouse name: Not on file   Number of children: 1   Years of education: Not on file   Highest education level: Not on file  Occupational History   Not on file  Tobacco Use   Smoking status: Never Smoker   Smokeless tobacco: Never Used  Substance and Sexual Activity   Alcohol use: No   Drug use: No   Sexual activity: Never  Other Topics Concern   Not on file  Social History Narrative   Not on file   Social Determinants of Health   Financial Resource Strain: Not on file  Food Insecurity: Not on file  Transportation Needs: Not on file  Physical Activity: Not on file  Stress: Not on file  Social Connections: Not on file  Intimate Partner Violence: Not on file    Family History  Problem Relation Age of Onset   Healthy Mother    Angina Father     ROS: Mild knee arthralgias but no fevers or chills, productive cough, hemoptysis, dysphasia, odynophagia, melena, hematochezia, dysuria, hematuria, rash, seizure activity, orthopnea, PND, pedal edema, claudication. Remaining systems are negative.  Physical Exam:   Blood pressure (!) 169/70, pulse (!) 107, height 5\' 4"  (1.626 m), weight 143 lb 6.4 oz (65 kg).  General:  Well developed/well nourished in NAD Skin warm/dry Patient not depressed No peripheral clubbing Back-normal HEENT-normal/normal eyelids Neck supple/normal carotid upstroke bilaterally; no bruits; no JVD; no thyromegaly chest - CTA/  normal expansion CV - RRR/normal S1 and S2; no murmurs, rubs or gallops;  PMI nondisplaced Abdomen -NT/ND, no HSM, no mass, + bowel sounds, no bruit 2+ femoral pulses, no bruits Ext-no edema, chords, 2+ DP Neuro-grossly nonfocal  ECG -June 27, 2020-atrial flutter.  Heart rate 150.  Follow-up electrocardiogram July 11, 2020 showed sinus rhythm with no ST changes.  Personally reviewed  Cardiogram personally reviewed  and shows sinus rhythm at a rate of 93, no ST changes.  A/P  1 paroxysmal atrial flutter-previous electrocardiogram personally reviewed and shows typical atrial flutter.  We will arrange echocardiogram to assess LV function.  CHA2DS2-VASc 4.  Will begin apixaban 5 mg twice daily.  Increase Toprol to 50 mg daily for improved rate control if atrial flutter recurs.  We discussed ablation but given her age I think conservative management is warranted.  We would like to avoid procedures if possible and she agrees.  I will see her back in 3 months to make sure that she is stable.  Note she had a TSH recently that was normal.  2 hypertension-blood pressure elevated.  Increase Toprol to 50 mg daily and follow.  Olga Millers, MD

## 2020-09-14 ENCOUNTER — Ambulatory Visit (INDEPENDENT_AMBULATORY_CARE_PROVIDER_SITE_OTHER): Payer: 59 | Admitting: Cardiology

## 2020-09-14 ENCOUNTER — Other Ambulatory Visit: Payer: Self-pay

## 2020-09-14 ENCOUNTER — Encounter: Payer: Self-pay | Admitting: Cardiology

## 2020-09-14 VITALS — BP 169/70 | HR 107 | Ht 64.0 in | Wt 143.4 lb

## 2020-09-14 DIAGNOSIS — I483 Typical atrial flutter: Secondary | ICD-10-CM | POA: Diagnosis not present

## 2020-09-14 DIAGNOSIS — I10 Essential (primary) hypertension: Secondary | ICD-10-CM

## 2020-09-14 MED ORDER — APIXABAN 5 MG PO TABS: 5.0000 mg | ORAL_TABLET | Freq: Two times a day (BID) | ORAL | 6 refills | Status: AC

## 2020-09-14 MED ORDER — METOPROLOL SUCCINATE ER 50 MG PO TB24: 50.0000 mg | ORAL_TABLET | Freq: Every day | ORAL | 3 refills | Status: DC

## 2020-09-14 NOTE — Patient Instructions (Signed)
Medication Instructions:   START ELIQUIS 5 MG ONE TABLET TWICE DAILY  INCREASE METOPROLOL TO 50 MG ONCE DAILY= 2 OF THE 25 MG TABLETS ONCE DAILY  *If you need a refill on your cardiac medications before your next appointment, please call your pharmacy*  Testing/Procedures:  Your physician has requested that you have an echocardiogram. Echocardiography is a painless test that uses sound waves to create images of your heart. It provides your doctor with information about the size and shape of your heart and how well your heart's chambers and valves are working. This procedure takes approximately one hour. There are no restrictions for this procedure.[address] 1ST FLOOR IMAGING DEPARTMENT     Follow-Up: At [facility], you and your health needs are our priority.  As part of our continuing mission to provide you with exceptional heart care, we have created designated Provider Care Teams.  These Care Teams include your primary Cardiologist (physician) and Advanced Practice Providers (APPs -  Physician Assistants and Nurse Practitioners) who all work together to provide you with the care you need, when you need it.  We recommend signing up for the patient portal called "MyChart".  Sign up information is provided on this After Visit Summary.  MyChart is used to connect with patients for Virtual Visits (Telemedicine).  Patients are able to view lab/test results, encounter notes, upcoming appointments, etc.  Non-urgent messages can be sent to your provider as well.   To learn more about what you can do with MyChart, go to [email or web address].    Your next appointment:   3 month(s)  The format for your next appointment:   In Person  Provider:   Olga Millers, MD

## 2020-09-19 LAB — CMP14+EGFR
ALT: 8 IU/L (ref 0–32)
AST: 14 IU/L (ref 0–40)
Albumin/Globulin Ratio: 2 (ref 1.2–2.2)
Albumin: 4.3 g/dL (ref 3.5–4.6)
Alkaline Phosphatase: 78 IU/L (ref 44–121)
BUN/Creatinine Ratio: 20 (ref 12–28)
BUN: 16 mg/dL (ref 10–36)
Bilirubin Total: 0.4 mg/dL (ref 0.0–1.2)
CO2: 23 mmol/L (ref 20–29)
Calcium: 10.9 mg/dL — ABNORMAL HIGH (ref 8.7–10.3)
Chloride: 102 mmol/L (ref 96–106)
Creatinine, Ser: 0.81 mg/dL (ref 0.57–1.00)
Globulin, Total: 2.1 g/dL (ref 1.5–4.5)
Glucose: 83 mg/dL (ref 65–99)
Potassium: 5.3 mmol/L — ABNORMAL HIGH (ref 3.5–5.2)
Sodium: 139 mmol/L (ref 134–144)
Total Protein: 6.4 g/dL (ref 6.0–8.5)
eGFR: 68 mL/min/{1.73_m2} (ref >59)

## 2020-09-19 LAB — PARATHYROID HORMONE, INTACT (NO CA): PTH: 72 pg/mL — ABNORMAL HIGH (ref 15–65)

## 2020-09-19 LAB — TSH: TSH: 0.855 u[IU]/mL (ref 0.450–4.500)

## 2020-09-19 LAB — CALCIUM, IONIZED: Calcium, Ion: 5.8 mg/dL — ABNORMAL HIGH (ref 4.5–5.6)

## 2020-10-30 DIAGNOSIS — I4892 Unspecified atrial flutter: Secondary | ICD-10-CM | POA: Insufficient documentation

## 2020-10-31 ENCOUNTER — Telehealth: Payer: Self-pay

## 2020-10-31 ENCOUNTER — Ambulatory Visit (HOSPITAL_BASED_OUTPATIENT_CLINIC_OR_DEPARTMENT_OTHER): Payer: 59

## 2020-10-31 NOTE — Telephone Encounter (Signed)
Transition Care Management Follow-up Telephone Call  Date of discharge and from where: 10/29/2020 from [facility]  How have you been since you were released from the hospital? Pt stated that she is feeling okay.   Any questions or concerns? No  Items Reviewed:  Did the pt receive and understand the discharge instructions provided? Yes   Medications obtained and verified? Yes   Other? No /  Any new allergies since your discharge? No   Dietary orders reviewed? Heart Healthy.  Do you have support at home? Yes   Functional Questionnaire: (I = Independent and D = Dependent) ADLs: I  Bathing/Dressing- I  Meal Prep- I  Eating- I  Maintaining continence- I  Transferring/Ambulation- I  Managing Meds- I   Follow up appointments reviewed:   PCP Hospital f/u appt confirmed? No    Specialist Hospital f/u appt confirmed? Yes  Pt has an appt with cardiology today.   Are transportation arrangements needed? No   If their condition worsens, is the pt aware to call PCP or go to the Emergency Dept.? Yes  Was the patient provided with contact information for the PCP's office or ED? Yes  Was to pt encouraged to call back with questions or concerns? Yes

## 2020-11-21 ENCOUNTER — Telehealth: Payer: Self-pay | Admitting: General Practice

## 2020-11-21 NOTE — Telephone Encounter (Signed)
Transition Care Management Unsuccessful Follow-up Telephone Call  Date of discharge and from where:  [facility] 11/21/20  Attempts:  1st Attempt  Reason for unsuccessful TCM follow-up call:  Left voice message

## 2020-11-22 NOTE — Telephone Encounter (Signed)
Transition Care Management Unsuccessful Follow-up Telephone Call  Date of discharge and from where:  [facility]  11/21/20  Attempts:  2nd Attempt  Reason for unsuccessful TCM follow-up call:  Left voice message

## 2020-11-23 NOTE — Telephone Encounter (Signed)
Transition Care Management Unsuccessful Follow-up Telephone Call  Date of discharge and from where:  [facility] 11/21/20  Attempts:  3rd Attempt  Reason for unsuccessful TCM follow-up call:  Left voice message

## 2020-12-05 ENCOUNTER — Telehealth: Payer: Self-pay | Admitting: General Practice

## 2020-12-05 ENCOUNTER — Other Ambulatory Visit: Payer: Self-pay

## 2020-12-05 ENCOUNTER — Ambulatory Visit (INDEPENDENT_AMBULATORY_CARE_PROVIDER_SITE_OTHER): Payer: 59 | Admitting: Osteopathic Medicine

## 2020-12-05 ENCOUNTER — Encounter: Payer: Self-pay | Admitting: Osteopathic Medicine

## 2020-12-05 VITALS — BP 167/54 | HR 66 | Temp 98.4°F | Wt 144.0 lb

## 2020-12-05 DIAGNOSIS — I480 Paroxysmal atrial fibrillation: Secondary | ICD-10-CM

## 2020-12-05 DIAGNOSIS — E039 Hypothyroidism, unspecified: Secondary | ICD-10-CM | POA: Diagnosis not present

## 2020-12-05 MED ORDER — LEVOTHYROXINE SODIUM 25 MCG PO TABS: 25.0000 ug | ORAL_TABLET | Freq: Every day | ORAL | 0 refills | Status: DC

## 2020-12-05 NOTE — Telephone Encounter (Signed)
Transition Care Management Unsuccessful Follow-up Telephone Call  Date of discharge and from where:  [facility] 12/02/20  Attempts:  1st Attempt  Reason for unsuccessful TCM follow-up call:  Left voice message  Patient has an OV scheduled for today 12/05/20 at 1110 with Dr. Lyn Hollingshead.

## 2020-12-05 NOTE — Progress Notes (Signed)
Christina [name] is a 87 y.o. female who presents to  [facility]  today, 12/05/20, seeking care for the following:  . ER follow-up. Pt presents today with her daughter with some questions about recent ER visit. ED records reviewed. Pt C/o palpitations, feeling out of sorts, EMS brought her to ED, was checked out, no AFib at that time. Patient has some confusion about "palpitations" versus "a-fib" diagnoses, she worries that anxiety might be causing some of these symptoms and inquires about medications for anxiety and for sleep. She would rather not take medicines, though. Increased stress, she is moving into assisted living soon. Daughter helps out but also tranvels often for work. They both have some questions about HCPOA paperwork, daughter is the patient's only child.   From last cardiology note 10/31/20: Sounds like some issues w/ inconsistent Rx dosing and inaccurate Rx list. Daughter had some concerns about meds being taken properly. Cardiology advised patient and to: "Stop Propranolol  Continue Metoprolol 50 mg twice per day Take Eliquis 5 mg twice per day  Take Lisinopril 10 mg per day  Stop Aspirin Return in about 3 months"     ASSESSMENT & PLAN with other pertinent findings:  The primary encounter diagnosis was Hypothyroidism, unspecified type. A diagnosis of Paroxysmal atrial fibrillation (HCC) was also pertinent to this visit.   I think anxiety is probably causing some somatic symptoms. Offered SSRI daily maintenance / prevention, advised I do not think the risk of sedation/confusion w/ anxiolytics is wise in her case. She would like to ry melatonin for sleep prn, I think this is fine, would certainly avoid anticholinergics or hypnotics given baseline mild confusion. Long discussion about atrial fibrillation, why she is taking the medicines she is taking, etc. Pt follows the conversation but seems to have difficulty  comprehending the details of this diagnosis even in very basic language. Daughter is present and seems to have a good undersanding  TSH was WNL but on the low side, will trial reducing dose levothyroxine. I'd like to simplify medication regimen as much as possible, so will avoid every other day dosing back and forth, going down to 25 mcg might be a bit of a drop but I think lower dose is safer than dose being too high. Will recheck labs in 6-8 weeks.   Re: advanced care planning - daughter is legal NOK and no other siblings, pt has no living spouse/parents. Advised that daughter is legal NOK but would still encourage discussing advanced directive topics and getting this paperwork in order so there is no confusion as to the patient's wishes. Counseled briefly on the difference between financial POA and healthcare POA - daughter is aware. It is my judgment that this patient has capacity to make choices about her care but health care team must recognize that she does have some difficulty understanding more complex details      Patient Instructions   Take medications as directed: Osteoporosis: Fosamax Thyroid: Levothyroxine   Changing this medicine! 50 mcg --> 25 mcg  Recheck levels in 8 weeks  Afib / Heart:   Eliquis to prevent stroke  Lisinopril to help blood pressure  Metoprolol to help heart rate   Sleep / anxiety Ok to take Melatonin OTC as needed, 3-10 mg per night  Consider daily anxiety medicine if needed   See paperwork for Advanced Directive / healthcare power of attorney      Atrial Fibrillation  Atrial fibrillation is a  type of heartbeat that is irregular or fast. If you have this condition, your heart beats without any order. This makes it hard for your heart to pump blood in a normal way. Atrial fibrillation may come and go, or it may become a long-lasting problem. If this condition is not treated, it can put you at higher risk for stroke, heart failure, and other heart  problems. What are the causes? This condition may be caused by diseases that damage the heart. They include:  High blood pressure.  Heart failure.  Heart valve disease.  Heart surgery. Other causes include:  Diabetes.  Thyroid disease.  Being overweight.  Kidney disease. Sometimes the cause is not known. What increases the risk? You are more likely to develop this condition if:  You are older.  You smoke.  You exercise often and very hard.  You have a family history of this condition.  You are a man.  You use drugs.  You drink a lot of alcohol.  You have lung conditions, such as emphysema, pneumonia, or COPD.  You have sleep apnea. What are the signs or symptoms? Common symptoms of this condition include:  A feeling that your heart is beating very fast.  Chest pain or discomfort.  Feeling short of breath.  Suddenly feeling light-headed or weak.  Getting tired easily during activity.  Fainting.  Sweating. In some cases, there are no symptoms. How is this treated? Treatment for this condition depends on underlying conditions and how you feel when you have atrial fibrillation. They include:  Medicines to: ? Prevent blood clots. ? Treat heart rate or heart rhythm problems.  Using devices, such as a pacemaker, to correct heart rhythm problems.  Doing surgery to remove the part of the heart that sends bad signals.  Closing an area where clots can form in the heart (left atrial appendage). In some cases, your doctor will treat other underlying conditions. Follow these instructions at home: Medicines  Take over-the-counter and prescription medicines only as told by your doctor.  Do not take any new medicines without first talking to your doctor.  If you are taking blood thinners: ? Talk with your doctor before you take any medicines that have aspirin or NSAIDs, such as ibuprofen, in them. ? Take your medicine exactly as told by your doctor. Take  it at the same time each day. ? Avoid activities that could hurt or bruise you. Follow instructions about how to prevent falls. ? Wear a bracelet that says you are taking blood thinners. Or, carry a card that lists what medicines you take. Lifestyle  Do not use any products that have nicotine or tobacco in them. These include cigarettes, e-cigarettes, and chewing tobacco. If you need help quitting, ask your doctor.  Eat heart-healthy foods. Talk with your doctor about the right eating plan for you.  Exercise regularly as told by your doctor.  Do not drink alcohol.  Lose weight if you are overweight.  Do not use drugs, including cannabis.      General instructions  If you have a condition that causes breathing to stop for a short period of time (apnea), treat it as told by your doctor.  Keep a healthy weight. Do not use diet pills unless your doctor says they are safe for you. Diet pills may make heart problems worse.  Keep all follow-up visits as told by your doctor. This is important. Contact a doctor if:  You notice a change in the speed, rhythm, or  strength of your heartbeat.  You are taking a blood-thinning medicine and you get more bruising.  You get tired more easily when you move or exercise.  You have a sudden change in weight. Get help right away if:  You have pain in your chest or your belly (abdomen).  You have trouble breathing.  You have side effects of blood thinners, such as blood in your vomit, poop (stool), or pee (urine), or bleeding that cannot stop.  You have any signs of a stroke. "BE FAST" is an easy way to remember the main warning signs: ? B - Balance. Signs are dizziness, sudden trouble walking, or loss of balance. ? E - Eyes. Signs are trouble seeing or a change in how you see. ? F - Face. Signs are sudden weakness or loss of feeling in the face, or the face or eyelid drooping on one side. ? A - Arms. Signs are weakness or loss of feeling in an  arm. This happens suddenly and usually on one side of the body. ? S - Speech. Signs are sudden trouble speaking, slurred speech, or trouble understanding what people say. ? T - Time. Time to call emergency services. Write down what time symptoms started.  You have other signs of a stroke, such as: ? A sudden, very bad headache with no known cause. ? Feeling like you may vomit (nausea). ? Vomiting. ? A seizure. These symptoms may be an emergency. Do not wait to see if the symptoms will go away. Get medical help right away. Call your local emergency services (911 in the [place] Do not drive yourself to the hospital.   Summary  Atrial fibrillation is a type of heartbeat that is irregular or fast.  You are at higher risk of this condition if you smoke, are older, have diabetes, or are overweight.  Follow your doctor's instructions about medicines, diet, exercise, and follow-up visits.  Get help right away if you have signs or symptoms of a stroke.  Get help right away if you cannot catch your breath, or you have chest pain or discomfort. This information is not intended to replace advice given to you by your health care provider. Make sure you discuss any questions you have with your health care provider. Document Revised: 12/10/2018 Document Reviewed: 12/10/2018 Elsevier Patient Education  2021 [employer].    Orders Placed This Encounter  Procedures  . TSH    Meds ordered this encounter  Medications  . levothyroxine (SYNTHROID) 25 MCG tablet    Sig: Take 1 tablet (25 mcg total) by mouth daily before breakfast.    Dispense:  90 tablet    Refill:  0     See below for relevant physical exam findings  See below for recent lab and imaging results reviewed  Medications, allergies, PMH, PSH, SocH, [place] reviewed below    Follow-up instructions: Return in about 8 weeks (around 01/30/2021) for LAB / PHLEBOTOMY VISIT, RECHECK THYROID LEVELS ON NEW DOSE  .                                        Exam:  BP (!) 167/54 (BP Location: Left Arm, Patient Position: Sitting, Cuff Size: Normal)   Pulse 66   Temp 98.4 F (36.9 C) (Oral)   Wt 144 lb 0.6 oz (65.3 kg)   BMI 24.72 kg/m   Constitutional: VS see above. General  Appearance: alert, well-developed, well-nourished, NAD  Neck: No masses, trachea midline.   Respiratory: Normal respiratory effort. no wheeze, no rhonchi, no rales  Cardiovascular: S1/S2 normal, no murmur, no rub/gallop auscultated. RRR. No irreg/irreg at the moment.   Musculoskeletal: Gait normal. Symmetric and independent movement of all extremities  Neurological: Normal balance/coordination. No tremor.  Skin: warm, dry, intact.   Psychiatric: Fair judgment/insight. Normal mood and affect. Oriented x3.   Current Meds  Medication Sig  . alendronate (FOSAMAX) 10 MG tablet Take 1 tablet (10 mg total) by mouth daily before breakfast.  . apixaban (ELIQUIS) 5 MG TABS tablet Take 1 tablet (5 mg total) by mouth 2 (two) times daily.  Marland Kitchen lisinopril (ZESTRIL) 10 MG tablet TAKE 1 TABLET DAILY (LAST REFILL. NEEDS LABS AND OFFICE VISIT WITH PRIMARY CARE PHYSICIAN)  . metoprolol succinate (TOPROL-XL) 50 MG 24 hr tablet Take 1 tablet (50 mg total) by mouth daily.  . [DISCONTINUED] levothyroxine (SYNTHROID) 50 MCG tablet TAKE 1 TABLET DAILY (LAST REFILL. NEED LABS AND OFFICE VISIT WITH PRIMARY CARE PHYSICIAN)    Allergies  Allergen Reactions  . Elemental Sulfur Hives  . Sulfa Antibiotics Rash, Other (See Comments) and Hives    unknown     Patient Active Problem List   Diagnosis Date Noted  . Left lumbar radiculitis 06/23/2020  . Constipation 09/17/2016  . Annual physical exam 03/29/2016  . Primary osteoarthritis of both knees 01/17/2016  . Hypothyroid 06/07/2015  . Osteoporosis 04/17/2015  . Hyperparathyroidism ([place]) 02/07/2015  . Hypercalcemia 01/27/2015  . Essential hypertension,  benign 04/28/2013  . Hyperlipidemia 04/28/2013  . Premature atrial contraction 02/28/2012  . Palpitations 02/05/2012  . Depression 04/16/2011  . Gastroesophageal reflux disease 04/16/2011  . Posttraumatic stress disorder 04/16/2011  . Anxiety 04/16/2011  . Restless legs syndrome (RLS) 04/16/2011    Family History  Problem Relation Age of Onset  . Healthy Mother   . Angina Father     Social History   Tobacco Use  Smoking Status Never Smoker  Smokeless Tobacco Never Used    Past Surgical History:  Procedure Laterality Date  . APPENDECTOMY    . TONSILLECTOMY      Immunization History  Administered Date(s) Administered  . Influenza Split 05/03/2007  . Influenza, High Dose Seasonal PF 07/29/2015, 05/18/2017, 07/28/2018  . Influenza,inj,Quad PF,6+ Mos 03/29/2016, 04/14/2019  . Influenza,inj,quad, With Preservative 04/14/2019  . Influenza-Unspecified 05/03/2007  . PFIZER(Purple Top)SARS-COV-2 Vaccination 08/03/2019, 08/24/2019, 05/28/2020  . Pneumococcal Conjugate-13 10/07/2017  . Pneumococcal Polysaccharide-23 07/02/1998  . Pneumococcal-Unspecified 07/02/1998    Recent Results (from the past 2160 hour(s))  CMP14+EGFR     Status: Abnormal   Collection Time: 09/16/20  8:28 AM  Result Value Ref Range   Glucose 83 65 - 99 mg/dL   BUN 16 10 - 36 mg/dL   Creatinine, Ser 0.81 0.57 - 1.00 mg/dL   eGFR 68 >59 mL/min/1.73   BUN/Creatinine Ratio 20 12 - 28   Sodium 139 134 - 144 mmol/L   Potassium 5.3 (H) 3.5 - 5.2 mmol/L   Chloride 102 96 - 106 mmol/L   CO2 23 20 - 29 mmol/L   Calcium 10.9 (H) 8.7 - 10.3 mg/dL   Total Protein 6.4 6.0 - 8.5 g/dL   Albumin 4.3 3.5 - 4.6 g/dL   Globulin, Total 2.1 1.5 - 4.5 g/dL   Albumin/Globulin Ratio 2.0 1.2 - 2.2   Bilirubin Total 0.4 0.0 - 1.2 mg/dL   Alkaline Phosphatase 78 44 - 121 IU/L   AST 14  0 - 40 IU/L   ALT 8 0 - 32 IU/L  TSH     Status: None   Collection Time: 09/16/20  8:28 AM  Result Value Ref Range   TSH 0.855 0.450 -  4.500 uIU/mL  Parathyroid hormone, intact (no Ca)     Status: Abnormal   Collection Time: 09/16/20  8:28 AM  Result Value Ref Range   PTH 72 (H) 15 - 65 pg/mL  Calcium, ionized     Status: Abnormal   Collection Time: 09/16/20  8:28 AM  Result Value Ref Range   Calcium, Ion 5.8 (H) 4.5 - 5.6 mg/dL    No results found.  Current Meds  Medication Sig  . alendronate (FOSAMAX) 10 MG tablet Take 1 tablet (10 mg total) by mouth daily before breakfast.  . apixaban (ELIQUIS) 5 MG TABS tablet Take 1 tablet (5 mg total) by mouth 2 (two) times daily.  Marland Kitchen lisinopril (ZESTRIL) 10 MG tablet TAKE 1 TABLET DAILY (LAST REFILL. NEEDS LABS AND OFFICE VISIT WITH PRIMARY CARE PHYSICIAN)  . metoprolol succinate (TOPROL-XL) 50 MG 24 hr tablet Take 1 tablet (50 mg total) by mouth daily.  . [DISCONTINUED] levothyroxine (SYNTHROID) 50 MCG tablet TAKE 1 TABLET DAILY (LAST REFILL. NEED LABS AND OFFICE VISIT WITH PRIMARY CARE PHYSICIAN)      All questions at time of visit were answered - patient instructed to contact office with any additional concerns or updates. ER/RTC precautions were reviewed with the patient as applicable.   Please note: manual typing as well as voice recognition software may have been used to produce this document - typos may escape review. Please contact Dr. Sheppard Coil for any needed clarifications.   Total encounter time on date of service, 12/05/20, was 40 minutes spent addressing problems/issues as noted above in [place], including time spent in discussion with patient regarding the HPI, ROS, confirming history, reviewing Assessment & Plan, as well as time spent on coordination of care, record review.

## 2020-12-05 NOTE — Patient Instructions (Addendum)
Take medications as directed: Osteoporosis: Fosamax Thyroid: Levothyroxine   Changing this medicine! 50 mcg --> 25 mcg  Recheck levels in 8 weeks  Afib / Heart:   Eliquis to prevent stroke  Lisinopril to help blood pressure  Metoprolol to help heart rate   Sleep / anxiety Ok to take Melatonin OTC as needed, 3-10 mg per night  Consider daily anxiety medicine if needed   See paperwork for Advanced Directive / healthcare power of attorney      Atrial Fibrillation  Atrial fibrillation is a type of heartbeat that is irregular or fast. If you have this condition, your heart beats without any order. This makes it hard for your heart to pump blood in a normal way. Atrial fibrillation may come and go, or it may become a long-lasting problem. If this condition is not treated, it can put you at higher risk for stroke, heart failure, and other heart problems. What are the causes? This condition may be caused by diseases that damage the heart. They include:  High blood pressure.  Heart failure.  Heart valve disease.  Heart surgery. Other causes include:  Diabetes.  Thyroid disease.  Being overweight.  Kidney disease. Sometimes the cause is not known. What increases the risk? You are more likely to develop this condition if:  You are older.  You smoke.  You exercise often and very hard.  You have a family history of this condition.  You are a man.  You use drugs.  You drink a lot of alcohol.  You have lung conditions, such as emphysema, pneumonia, or COPD.  You have sleep apnea. What are the signs or symptoms? Common symptoms of this condition include:  A feeling that your heart is beating very fast.  Chest pain or discomfort.  Feeling short of breath.  Suddenly feeling light-headed or weak.  Getting tired easily during activity.  Fainting.  Sweating. In some cases, there are no symptoms. How is this treated? Treatment for this condition depends on  underlying conditions and how you feel when you have atrial fibrillation. They include:  Medicines to: ? Prevent blood clots. ? Treat heart rate or heart rhythm problems.  Using devices, such as a pacemaker, to correct heart rhythm problems.  Doing surgery to remove the part of the heart that sends bad signals.  Closing an area where clots can form in the heart (left atrial appendage). In some cases, your doctor will treat other underlying conditions. Follow these instructions at home: Medicines  Take over-the-counter and prescription medicines only as told by your doctor.  Do not take any new medicines without first talking to your doctor.  If you are taking blood thinners: ? Talk with your doctor before you take any medicines that have aspirin or NSAIDs, such as ibuprofen, in them. ? Take your medicine exactly as told by your doctor. Take it at the same time each day. ? Avoid activities that could hurt or bruise you. Follow instructions about how to prevent falls. ? Wear a bracelet that says you are taking blood thinners. Or, carry a card that lists what medicines you take. Lifestyle  Do not use any products that have nicotine or tobacco in them. These include cigarettes, e-cigarettes, and chewing tobacco. If you need help quitting, ask your doctor.  Eat heart-healthy foods. Talk with your doctor about the right eating plan for you.  Exercise regularly as told by your doctor.  Do not drink alcohol.  Lose weight if you are overweight.  Do not use drugs, including cannabis.      General instructions  If you have a condition that causes breathing to stop for a short period of time (apnea), treat it as told by your doctor.  Keep a healthy weight. Do not use diet pills unless your doctor says they are safe for you. Diet pills may make heart problems worse.  Keep all follow-up visits as told by your doctor. This is important. Contact a doctor if:  You notice a change in  the speed, rhythm, or strength of your heartbeat.  You are taking a blood-thinning medicine and you get more bruising.  You get tired more easily when you move or exercise.  You have a sudden change in weight. Get help right away if:  You have pain in your chest or your belly (abdomen).  You have trouble breathing.  You have side effects of blood thinners, such as blood in your vomit, poop (stool), or pee (urine), or bleeding that cannot stop.  You have any signs of a stroke. "BE FAST" is an easy way to remember the main warning signs: ? B - Balance. Signs are dizziness, sudden trouble walking, or loss of balance. ? E - Eyes. Signs are trouble seeing or a change in how you see. ? F - Face. Signs are sudden weakness or loss of feeling in the face, or the face or eyelid drooping on one side. ? A - Arms. Signs are weakness or loss of feeling in an arm. This happens suddenly and usually on one side of the body. ? S - Speech. Signs are sudden trouble speaking, slurred speech, or trouble understanding what people say. ? T - Time. Time to call emergency services. Write down what time symptoms started.  You have other signs of a stroke, such as: ? A sudden, very bad headache with no known cause. ? Feeling like you may vomit (nausea). ? Vomiting. ? A seizure. These symptoms may be an emergency. Do not wait to see if the symptoms will go away. Get medical help right away. Call your local emergency services (911 in the [place] Do not drive yourself to the hospital.   Summary  Atrial fibrillation is a type of heartbeat that is irregular or fast.  You are at higher risk of this condition if you smoke, are older, have diabetes, or are overweight.  Follow your doctor's instructions about medicines, diet, exercise, and follow-up visits.  Get help right away if you have signs or symptoms of a stroke.  Get help right away if you cannot catch your breath, or you have chest pain or  discomfort. This information is not intended to replace advice given to you by your health care provider. Make sure you discuss any questions you have with your health care provider. Document Revised: 12/10/2018 Document Reviewed: 12/10/2018 Elsevier Patient Education  2021 [employer].

## 2020-12-07 NOTE — Telephone Encounter (Signed)
Transition Care Management Follow-up Telephone Call  Date of discharge and from where: [facility] 12/02/20  How have you been since you were released from the hospital? Patient had OV on 12/05/20.  Any questions or concerns? No

## 2020-12-14 ENCOUNTER — Ambulatory Visit: Payer: 59 | Admitting: Cardiology

## 2020-12-19 ENCOUNTER — Other Ambulatory Visit: Payer: Self-pay | Admitting: Cardiology

## 2020-12-19 DIAGNOSIS — I483 Typical atrial flutter: Secondary | ICD-10-CM

## 2020-12-21 ENCOUNTER — Telehealth: Payer: Self-pay | Admitting: General Practice

## 2020-12-21 NOTE — Telephone Encounter (Signed)
Transition Care Management Unsuccessful Follow-up Telephone Call  Date of discharge and from where:  12/20/20 from [facility]  Attempts:  1st Attempt  Reason for unsuccessful TCM follow-up call:  Unable to reach patient    

## 2020-12-22 NOTE — Telephone Encounter (Signed)
Transition Care Management Unsuccessful Follow-up Telephone Call  Date of discharge and from where:  12/20/2020 from [facility]  Attempts:  2nd Attempt  Reason for unsuccessful TCM follow-up call:  Unable to reach patient

## 2020-12-23 NOTE — Telephone Encounter (Signed)
Transition Care Management Unsuccessful Follow-up Telephone Call  Date of discharge and from where:  12/20/2020 from [facility]  Attempts:  3rd Attempt  Reason for unsuccessful TCM follow-up call:  Unable to reach patient    

## 2021-01-03 ENCOUNTER — Telehealth (INDEPENDENT_AMBULATORY_CARE_PROVIDER_SITE_OTHER): Payer: 59 | Admitting: Osteopathic Medicine

## 2021-01-03 DIAGNOSIS — I483 Typical atrial flutter: Secondary | ICD-10-CM | POA: Diagnosis not present

## 2021-01-03 DIAGNOSIS — E039 Hypothyroidism, unspecified: Secondary | ICD-10-CM

## 2021-01-03 DIAGNOSIS — R4189 Other symptoms and signs involving cognitive functions and awareness: Secondary | ICD-10-CM | POA: Diagnosis not present

## 2021-01-03 MED ORDER — AMBULATORY NON FORMULARY MEDICATION: 99 refills | Status: DC

## 2021-01-03 MED ORDER — METOPROLOL SUCCINATE ER 50 MG PO TB24: ORAL_TABLET | ORAL | 3 refills | Status: DC

## 2021-01-03 MED ORDER — ESCITALOPRAM OXALATE 5 MG PO TABS: 5.0000 mg | ORAL_TABLET | Freq: Every day | ORAL | 0 refills | Status: DC

## 2021-01-03 NOTE — Progress Notes (Signed)
Telemedicine Visit via  Audio only - telephone (patient preference /  technical difficulty with MyChart video application)  I connected with Christina [name] on 01/03/21 at 11:13 AM  by phone or  telemedicine application as noted above  I verified that I am speaking with or regarding  the correct patient using two identifiers.  Participants: Myself, Dr Sunnie Nielsen DO Patient: Christina [name] Patient proxy if applicable: daughter, Christina [name] Other, if applicable: none  Patient is at home I am in office at [facility]    I discussed the limitations of evaluation and management  by telemedicine and the availability of in person appointments.  The participant(s) above expressed understanding and  agreed to proceed with this appointment via telemedicine.       History of Present Illness: Christina [name] is a 87 y.o. female who would like to discuss some issues w/ forgetfulness, anxiety / insomnia. Staff has suggested ordering some counseling.   Anxiety / insomnia - every day and almost every night. She seems to be getting forgetful.  Daughter states that patient keeps waking up thinking about particularly financial concerns, like where the checkbook might be located, or bills paid appropriately, etc.  She does not seem to leave her apartment at assisted living, minimal social activity.  She talks a lot about wanting to go back to her house, like calling a cab and going on her own.  Daughter is very concerned about potential for wandering.     Observations/Objective: There were no vitals taken for this visit. BP Readings from Last 3 Encounters:  12/05/20 (!) 167/54  09/14/20 (!) 169/70  07/11/20 (!) 146/69     Lab and Radiology Results No results found for this or any previous visit (from the past 72 hour(s)). No results found.     Assessment and Plan: 87 y.o. 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 Diagnoses  of Typical atrial flutter (HCC) and Hypothyroidism, unspecified type were also pertinent to this visit.  Anxiety/behavioral disturbance with likely dementia.  Will trial low-dose SSRI.  Would use significant caution around potentially sedating medications, would like to avoid anything for sleep aid at this point.  Counseling ordered.  Encouraged family to visit frequently, try to keep patient on a structured daily schedule, try to increase her social interaction as much as possible and physical exercises much as possible  Refilled medications as below   PDMP not reviewed this encounter. No orders of the defined types were placed in this encounter.  Meds ordered this encounter  Medications   metoprolol succinate (TOPROL-XL) 50 MG 24 hr tablet    Sig: 1 tablet (50 mg) po in the morning, 2 tablets (100 mg) po qhs    Dispense:  270 tablet    Refill:  3   escitalopram (LEXAPRO) 5 MG tablet    Sig: Take 1 tablet (5 mg total) by mouth daily. Take in evening/bedtime (can take w/ second dose of Eliquis)    Dispense:  90 tablet    Refill:  0   AMBULATORY NON FORMULARY MEDICATION    Sig: [facility] - PT TO PARTICIPATE IN COUNSELING / BEHAVIORAL HEALTH    Dispense:  1 Units    Refill:  99   There are no Patient Instructions on file for this visit.  Instructions sent via MyChart.   Follow Up Instructions: Return in about 1 month (around 02/06/2021) for CHECK UP ON ANXIETY / MEMORY (HAS LAB APPT THAT DAY, CAN SEE  DR A TOO) .    I discussed the assessment and treatment plan with the patient. The patient was provided an opportunity to ask questions and all were answered. The patient agreed with the plan and demonstrated an understanding of the instructions.   The patient was advised to call back or seek an in-person evaluation if any new concerns, if symptoms worsen or if the condition fails to improve as anticipated.  21 minutes of non-face-to-face time was provided during this  encounter.      . . . . . . . . . . . . . Marland Kitchen                   Historical information moved to improve visibility of documentation.  Past Medical History:  Diagnosis Date   Hyperlipidemia    Hypertension    Hypothyroid    Past Surgical History:  Procedure Laterality Date   APPENDECTOMY     TONSILLECTOMY     Social History   Tobacco Use   Smoking status: Never   Smokeless tobacco: Never  Substance Use Topics   Alcohol use: No   family history includes Angina in her father; Healthy in her mother.  Medications: Current Outpatient Medications  Medication Sig Dispense Refill   AMBULATORY NON FORMULARY MEDICATION [facility] - PT TO PARTICIPATE IN COUNSELING / BEHAVIORAL HEALTH 1 Units 99   escitalopram (LEXAPRO) 5 MG tablet Take 1 tablet (5 mg total) by mouth daily. Take in evening/bedtime (can take w/ second dose of Eliquis) 90 tablet 0   alendronate (FOSAMAX) 10 MG tablet Take 1 tablet (10 mg total) by mouth daily before breakfast. 90 tablet 3   apixaban (ELIQUIS) 5 MG TABS tablet Take 1 tablet (5 mg total) by mouth 2 (two) times daily. 60 tablet 6   levothyroxine (SYNTHROID) 25 MCG tablet Take 1 tablet (25 mcg total) by mouth daily before breakfast. 90 tablet 0   lisinopril (ZESTRIL) 10 MG tablet TAKE 1 TABLET DAILY (LAST REFILL. NEEDS LABS AND OFFICE VISIT WITH PRIMARY CARE PHYSICIAN) 90 tablet 3   metoprolol succinate (TOPROL-XL) 50 MG 24 hr tablet 1 tablet (50 mg) po in the morning, 2 tablets (100 mg) po qhs 270 tablet 3   No current facility-administered medications for this visit.   Allergies  Allergen Reactions   Elemental Sulfur Hives   Sulfa Antibiotics Rash, Other (See Comments) and Hives    unknown      If phone visit, billing and coding can please add appropriate modifier if needed

## 2021-01-04 ENCOUNTER — Encounter: Payer: Self-pay | Admitting: Osteopathic Medicine

## 2021-01-04 DIAGNOSIS — R4189 Other symptoms and signs involving cognitive functions and awareness: Secondary | ICD-10-CM

## 2021-01-04 DIAGNOSIS — F411 Generalized anxiety disorder: Secondary | ICD-10-CM

## 2021-01-04 NOTE — Telephone Encounter (Signed)
Referral ordered - I don't see too many for home health behavioral health so let me know if this is ok or if I need to change order

## 2021-02-01 ENCOUNTER — Telehealth: Payer: Self-pay | Admitting: General Practice

## 2021-02-01 NOTE — Telephone Encounter (Signed)
Transition Care Management Unsuccessful Follow-up Telephone Call  Date of discharge and from where:  01/31/21 from [facility]  Attempts:  1st Attempt  Reason for unsuccessful TCM follow-up call:  Left voice message

## 2021-02-03 ENCOUNTER — Encounter: Payer: Self-pay | Admitting: Osteopathic Medicine

## 2021-02-03 ENCOUNTER — Ambulatory Visit (INDEPENDENT_AMBULATORY_CARE_PROVIDER_SITE_OTHER): Payer: 59 | Admitting: Osteopathic Medicine

## 2021-02-03 ENCOUNTER — Other Ambulatory Visit: Payer: Self-pay

## 2021-02-03 DIAGNOSIS — I483 Typical atrial flutter: Secondary | ICD-10-CM

## 2021-02-03 MED ORDER — LEVOTHYROXINE SODIUM 25 MCG PO TABS: 25.0000 ug | ORAL_TABLET | Freq: Every day | ORAL | 3 refills | Status: DC

## 2021-02-03 MED ORDER — METOPROLOL SUCCINATE ER 50 MG PO TB24: 50.0000 mg | ORAL_TABLET | Freq: Every day | ORAL | 3 refills | Status: DC

## 2021-02-03 NOTE — Telephone Encounter (Deleted)
Transition Care Management Unsuccessful Follow-up Telephone Call  Date of discharge and from where:  01/31/21 from [facility]  Attempts:  2nd Attempt  Reason for unsuccessful TCM follow-up call:  Left voice message Patient does have OV scheduled for 02/06/21 with PCP.

## 2021-02-03 NOTE — Patient Instructions (Addendum)
Refilled thyroid medications.  CANCEL appointment for Monday 02/06/21.  CHANGE metoprolol: taking ONE 50 mg tablet ONCE per day in evening.  Blood pressure goal: top number around 150, bottom number less than 90.     FYI to my patients: After six years here, I will be leaving practice at [facility]. My last day here will be 03/31/2021. I will continue to provide your care up until that date, and you will still be considered a patient here after that as long as you want to be!    You will get a letter in the mail explaining details, but after 03/31/2021, my patients have several options to continue care:  1) you can establish care with Dr. Everrett Coombe or Christen Butter NP, who are accepting new patients here and are absorbing many folks from my current patient panel, OR...  2) you can see any available provider here on as-needed basis until my official replacement starts (hiring a new doctor has not been finalized yet), OR.Marland KitchenMarland Kitchen 3) if you choose to seek care elsewhere, this office will be happy to facilitate transfer of records, and will refill medications on a case-by-case basis.   It is bittersweet to leave! I will be practicing inpatient hospital medicine at [facility], continuing to serve as chair for [facility] [employer], and I will also be teaching medical learners. I have truly enjoyed taking care of folks here, but I am also excited for my next adventure doing something a bit different. Take care, and please let us know if you have any questions!   -Dr. Mervyn Skeeters.

## 2021-02-03 NOTE — Telephone Encounter (Signed)
Transition Care Management Follow-up Telephone Call Date of discharge and from where: 01/31/21 from [facility] How have you been since you were released from the hospital? Patient had OV today with PCP 02/03/21. Any questions or concerns? No

## 2021-02-03 NOTE — Progress Notes (Signed)
Christina [name] is a 87 y.o. female who presents to  [facility] Primary Care & Sports Medicine at [facility]  today, 02/03/21, seeking care for the following:  ER follow-up: visit 3 days ago for episodic dizziness x2 episodes over 4 days. Only change was patient was receiving help taking her meds at assisted living, she often forget her medications or take extra. ER decreased Toprol XL to 25 mg bid, their reprot says 25 mg daily, the med list still says 50 bid... pt is taking Half in AM (25 mg) and whole in PM (50 mg)   BP Readings from Last 3 Encounters:  02/03/21 (!) 159/64  12/05/20 (!) 167/54  09/14/20 (!) 169/70     ASSESSMENT & PLAN with other pertinent findings:  The encounter diagnosis was Typical atrial flutter (HCC) liekly cause of dizziness, possible lightheaded / orthostatic as well.   Will reduce beta blocker to 50 mg daily dose to simplify regimen. SBP above goal but DBP ok. Has f/u w/ cardiology soon. Advised if ever concerned, please get checked out, but she is possibly feeling herself going in and out of fib/flutter which is not necessarily dangerous - would seek futher education / action plan based on cardiology recommendations. If symtpoms WORSEN with decreased beta blocker dosage, plet me know ASAp and will consider go back to TDD 75-100 mg  Patient Instructions  Refilled thyroid medications.  CANCEL appointment for Monday 02/06/21.  CHANGE metoprolol: taking ONE 50 mg tablet ONCE per day in evening.  Blood pressure goal: top number around 150, bottom number less than 90.     FYI to my patients: After six years here, I will be leaving practice at [facility]. My last day here will be 03/31/2021. I will continue to provide your care up until that date, and you will still be considered a patient here after that as long as you want to be!    You will get a letter in the mail explaining details, but after 03/31/2021, my patients have  several options to continue care:  1) you can establish care with Dr. Everrett Coombe or Christen Butter NP, who are accepting new patients here and are absorbing many folks from my current patient panel, OR...  2) you can see any available provider here on as-needed basis until my official replacement starts (hiring a new doctor has not been finalized yet), OR.Marland KitchenMarland Kitchen 3) if you choose to seek care elsewhere, this office will be happy to facilitate transfer of records, and will refill medications on a case-by-case basis.   It is bittersweet to leave! I will be practicing inpatient hospital medicine at [facility], continuing to serve as chair for [facility] Ethics, and I will also be teaching medical learners. I have truly enjoyed taking care of folks here, but I am also excited for my next adventure doing something a bit different. Take care, and please let us know if you have any questions!   -Dr. Mervyn Skeeters.     No orders of the defined types were placed in this encounter.   Meds ordered this encounter  Medications   metoprolol succinate (TOPROL-XL) 50 MG 24 hr tablet    Sig: Take 1 tablet (50 mg total) by mouth daily.    Dispense:  90 tablet    Refill:  3   levothyroxine (SYNTHROID) 25 MCG tablet    Sig: Take 1 tablet (25 mcg total) by mouth daily before breakfast.    Dispense:  90  tablet    Refill:  3     See below for relevant physical exam findings  See below for recent lab and imaging results reviewed  Medications, allergies, PMH, PSH, SocH, FamH reviewed below    Follow-up instructions: Return in about 3 months (around 05/06/2021) for ESTABLISH WITH DR MATTHEWS, SEE [place] SOONER IF NEEDED. CALL/MESSAGE W/ QUESTIONS.                                        Exam:  BP (!) 159/64   Pulse 70   Ht 5\' 4"  (1.626 m)   Wt 141 lb (64 kg)   BMI 24.20 kg/m  Constitutional: VS see above. General Appearance: alert, well-developed, well-nourished, NAD Neck: No masses,  trachea midline.  Respiratory: Normal respiratory effort. no wheeze, no rhonchi, no rales Cardiovascular: S1/S2 normal, no murmur, no rub/gallop auscultated. RRR. Sounds like NSR at this time.  Neurological: Normal balance/coordination. No tremor. Skin: warm, dry, intact.  Psychiatric: Normal judgment/insight. Normal mood and affect. Oriented x3.   No outpatient medications have been marked as taking for the 02/03/21 encounter (Office Visit) with 04/05/21, DO.    Allergies  Allergen Reactions   Elemental Sulfur Hives   Sulfa Antibiotics Rash, Other (See Comments) and Hives    unknown     Patient Active Problem List   Diagnosis Date Noted   Left lumbar radiculitis 06/23/2020   Constipation 09/17/2016   Annual physical exam 03/29/2016   Primary osteoarthritis of both knees 01/17/2016   Hypothyroid 06/07/2015   Osteoporosis 04/17/2015   Hyperparathyroidism (HCC) 02/07/2015   Hypercalcemia 01/27/2015   Essential hypertension, benign 04/28/2013   Hyperlipidemia 04/28/2013   Premature atrial contraction 02/28/2012   Palpitations 02/05/2012   Depression 04/16/2011   Gastroesophageal reflux disease 04/16/2011   Posttraumatic stress disorder 04/16/2011   Anxiety 04/16/2011   Restless legs syndrome (RLS) 04/16/2011    Family History  Problem Relation Age of Onset   Healthy Mother    Angina Father     Social History   Tobacco Use  Smoking Status Never  Smokeless Tobacco Never    Past Surgical History:  Procedure Laterality Date   APPENDECTOMY     TONSILLECTOMY      Immunization History  Administered Date(s) Administered   Influenza Split 05/03/2007   Influenza, High Dose Seasonal PF 07/29/2015, 05/18/2017, 07/28/2018   Influenza,inj,Quad PF,6+ Mos 03/29/2016, 04/14/2019   Influenza,inj,quad, With Preservative 04/14/2019   Influenza-Unspecified 05/03/2007   PFIZER(Purple Top)SARS-COV-2 Vaccination 08/03/2019, 08/24/2019, 05/28/2020   Pneumococcal  Conjugate-13 10/07/2017   Pneumococcal Polysaccharide-23 07/02/1998   Pneumococcal-Unspecified 07/02/1998    No results found for this or any previous visit (from the past 2160 hour(s)).  No results found.     All questions at time of visit were answered - patient instructed to contact office with any additional concerns or updates. ER/RTC precautions were reviewed with the patient as applicable.   Please note: manual typing as well as voice recognition software may have been used to produce this document - typos may escape review. Please contact Dr. [phone] for any needed clarifications.

## 2021-02-06 ENCOUNTER — Other Ambulatory Visit: Payer: 59

## 2021-02-19 DIAGNOSIS — R55 Syncope and collapse: Secondary | ICD-10-CM | POA: Insufficient documentation

## 2021-02-19 DIAGNOSIS — I4891 Unspecified atrial fibrillation: Secondary | ICD-10-CM | POA: Insufficient documentation

## 2021-02-20 ENCOUNTER — Telehealth: Payer: Self-pay | Admitting: *Deleted

## 2021-02-20 NOTE — Telephone Encounter (Signed)
Transition Care Management Unsuccessful Follow-up Telephone Call  Date of discharge and from where:  02/19/2021 [facility]  Attempts:  1st Attempt  Reason for unsuccessful TCM follow-up call:  Unable to leave message

## 2021-02-21 NOTE — Telephone Encounter (Signed)
Transition Care Management Unsuccessful Follow-up Telephone Call  Date of discharge and from where:  02/19/2021 [facility]  Attempts:  2nd Attempt  Reason for unsuccessful TCM follow-up call:  Unable to leave message

## 2021-02-22 NOTE — Telephone Encounter (Signed)
Transition Care Management Unsuccessful Follow-up Telephone Call  Date of discharge and from where:  02/19/2021 [facility]  Attempts:  3rd Attempt  Reason for unsuccessful TCM follow-up call:  Unable to leave message

## 2021-03-07 ENCOUNTER — Telehealth: Payer: Self-pay

## 2021-03-07 NOTE — Telephone Encounter (Signed)
Transition Care Management Unsuccessful Follow-up Telephone Call  Date of discharge and from where:  03/06/2021 from [facility]  Attempts:  1st Attempt  Reason for unsuccessful TCM follow-up call:  Left voice message

## 2021-03-09 NOTE — Telephone Encounter (Signed)
Transition Care Management Unsuccessful Follow-up Telephone Call  Date of discharge and from where:  03/06/2021 from [facility]  Attempts:  2nd Attempt  Reason for unsuccessful TCM follow-up call:  Left voice message

## 2021-03-10 NOTE — Telephone Encounter (Signed)
Transition Care Management Unsuccessful Follow-up Telephone Call  Date of discharge and from where:  03/06/2021 from [facility]  Attempts:  3rd Attempt  Reason for unsuccessful TCM follow-up call:  Unable to reach patient

## 2021-03-13 DIAGNOSIS — R001 Bradycardia, unspecified: Secondary | ICD-10-CM | POA: Insufficient documentation

## 2021-03-15 DIAGNOSIS — I495 Sick sinus syndrome: Secondary | ICD-10-CM | POA: Insufficient documentation

## 2021-03-17 ENCOUNTER — Telehealth: Payer: Self-pay

## 2021-03-17 ENCOUNTER — Telehealth: Payer: Self-pay | Admitting: General Practice

## 2021-03-17 NOTE — Telephone Encounter (Signed)
FYI -   Charise Killian from [place] (caseworker) called regarding patient's medical care. She stated that she can be contacted with any inquiries or updates. She is assigned to the patient to ensure that she follows up with provider as needed. Patient has an upcoming appt on 03/21/21 with provider.

## 2021-03-17 NOTE — Telephone Encounter (Signed)
Transition Care Management Unsuccessful Follow-up Telephone Call  Date of discharge and from where:  03/16/21 from [facility]  Attempts:  1st Attempt  Reason for unsuccessful TCM follow-up call:  Unable to leave message

## 2021-03-20 NOTE — Telephone Encounter (Signed)
Transition Care Management Unsuccessful Follow-up Telephone Call  Date of discharge and from where:  03/16/21 from [facility]  Attempts:  2nd Attempt  Reason for unsuccessful TCM follow-up call:  Unable to leave message

## 2021-03-21 ENCOUNTER — Other Ambulatory Visit: Payer: Self-pay

## 2021-03-21 ENCOUNTER — Ambulatory Visit (INDEPENDENT_AMBULATORY_CARE_PROVIDER_SITE_OTHER): Payer: 59 | Admitting: Osteopathic Medicine

## 2021-03-21 ENCOUNTER — Encounter: Payer: Self-pay | Admitting: Osteopathic Medicine

## 2021-03-21 VITALS — BP 148/53 | HR 63 | Temp 98.5°F | Ht 64.0 in | Wt 137.0 lb

## 2021-03-21 DIAGNOSIS — I495 Sick sinus syndrome: Secondary | ICD-10-CM

## 2021-03-21 DIAGNOSIS — R4189 Other symptoms and signs involving cognitive functions and awareness: Secondary | ICD-10-CM | POA: Diagnosis not present

## 2021-03-21 DIAGNOSIS — F411 Generalized anxiety disorder: Secondary | ICD-10-CM | POA: Diagnosis not present

## 2021-03-21 DIAGNOSIS — I10 Essential (primary) hypertension: Secondary | ICD-10-CM | POA: Diagnosis not present

## 2021-03-21 MED ORDER — METOPROLOL SUCCINATE ER 100 MG PO TB24: 100.0000 mg | ORAL_TABLET | Freq: Every day | ORAL | 3 refills | Status: DC

## 2021-03-21 MED ORDER — LISINOPRIL 10 MG PO TABS: ORAL_TABLET | ORAL | 3 refills | Status: DC

## 2021-03-21 MED ORDER — ESCITALOPRAM OXALATE 5 MG PO TABS: 5.0000 mg | ORAL_TABLET | Freq: Every day | ORAL | 3 refills | Status: DC

## 2021-03-21 NOTE — Progress Notes (Signed)
Christina [name] is a 87 y.o. female who presents to  [facility] Primary Care & Sports Medicine at [facility]  today, 03/21/21, seeking care for the following:  HFU - PACEMAKER PLACEMENT  Inpatient 03/13/21-03/16/21, Tachy-brady syndrome and Afib, pacemaker placed 03/14/21 Meds on discharge: Toprol XL 100 mg daily (changed) Fosamax 10 mg daily Eliquis 5 mg bid Lexapro 5 mg daily  Synthroid 50 and 25 mcg doses are on list (???) TSH WNL 03/12/21 We only have the 25 mcg dose on our list, pt reports only taking the 25 mcg dose NOT the 50  Lisinopril 10 mg daily  Zantac 150 mg daily prn Has appt w/ cardiology      ASSESSMENT & PLAN with other pertinent findings:  The primary encounter diagnosis was Tachy-brady syndrome [facility]) s/p pacemaker placement 03/2021. 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   Doing well post hospitalization Here w/ daughter, she has no concerns    There are no Patient Instructions on file for this visit.  No orders of the defined types were placed in this encounter.   Meds ordered this encounter  Medications   metoprolol succinate (TOPROL-XL) 100 MG 24 hr tablet    Sig: Take 1 tablet (100 mg total) by mouth daily.    Dispense:  90 tablet    Refill:  3   escitalopram (LEXAPRO) 5 MG tablet    Sig: Take 1 tablet (5 mg total) by mouth daily.    Dispense:  90 tablet    Refill:  3   lisinopril (ZESTRIL) 10 MG tablet    Sig: TAKE 1 TABLET DAILY    Dispense:  90 tablet    Refill:  3     See below for relevant physical exam findings  See below for recent lab and imaging results reviewed  Medications, allergies, PMH, PSH, SocH, FamH reviewed below    Follow-up instructions: Keep appt w/ Dr Ashley Royalty, see [place] sooner if needed                                         Exam:  BP (!) 148/53    Pulse 63   Temp 98.5 F (36.9 C)   Ht 5\' 4"  (1.626 m)   Wt 137 lb (62.1 kg)   SpO2 95%   BMI 23.52 kg/m  Constitutional: VS see above. General Appearance: alert, well-developed, well-nourished, NAD Neck: No masses, trachea midline.  Respiratory: Normal respiratory effort. no wheeze, no rhonchi, no rales Cardiovascular: S1/S2 normal, no murmur, no rub/gallop auscultated. RRR. No afib or irregularity auscultated Musculoskeletal: Gait normal. Symmetric and independent movement of all extremities Neurological: Normal balance/coordination. No tremor. Skin: warm, dry, intact. Scar from pacemaker placement is healing normally  Psychiatric: Normal judgment/insight. Normal mood and affect. Oriented x3.   Current Meds  Medication Sig   alendronate (FOSAMAX) 10 MG tablet Take 1 tablet (10 mg total) by mouth daily before breakfast.   apixaban (ELIQUIS) 5 MG TABS tablet Take 1 tablet (5 mg total) by mouth 2 (two) times daily.   levothyroxine (SYNTHROID) 25 MCG tablet Take 1 tablet (25 mcg total) by mouth daily before breakfast.   [DISCONTINUED] escitalopram (LEXAPRO) 5 MG tablet Take 5 mg by mouth daily.   [DISCONTINUED] lisinopril (ZESTRIL) 10 MG tablet TAKE 1 TABLET DAILY (LAST REFILL. NEEDS LABS AND OFFICE  VISIT WITH PRIMARY CARE PHYSICIAN)   [DISCONTINUED] metoprolol succinate (TOPROL-XL) 100 MG 24 hr tablet Take 100 mg by mouth daily.    Allergies  Allergen Reactions   Elemental Sulfur Hives   Sulfa Antibiotics Rash, Other (See Comments) and Hives    unknown     Patient Active Problem List   Diagnosis Date Noted   Tachy-brady syndrome (HCC) 03/15/2021   Symptomatic bradycardia 03/13/2021   AF (atrial fibrillation) (HCC) 02/19/2021   Syncope and collapse 02/19/2021   Paroxysmal atrial flutter (HCC) 10/30/2020   Left lumbar radiculitis 06/23/2020   Constipation 09/17/2016   Annual physical exam 03/29/2016   Primary osteoarthritis of both knees 01/17/2016   Hypothyroid 06/07/2015    Osteoporosis 04/17/2015   Hyperparathyroidism (HCC) 02/07/2015   Hypercalcemia 01/27/2015   Essential hypertension, benign 04/28/2013   Hyperlipidemia 04/28/2013   Premature atrial contraction 02/28/2012   Palpitations 02/05/2012   Depression 04/16/2011   Gastroesophageal reflux disease 04/16/2011   Posttraumatic stress disorder 04/16/2011   Anxiety 04/16/2011   Restless legs syndrome (RLS) 04/16/2011    Family History  Problem Relation Age of Onset   Healthy Mother    Angina Father     Social History   Tobacco Use  Smoking Status Never  Smokeless Tobacco Never    Past Surgical History:  Procedure Laterality Date   APPENDECTOMY     TONSILLECTOMY      Immunization History  Administered Date(s) Administered   Influenza Split 05/03/2007   Influenza, High Dose Seasonal PF 07/29/2015, 05/18/2017, 07/28/2018   Influenza,inj,Quad PF,6+ Mos 03/29/2016, 04/14/2019   Influenza,inj,quad, With Preservative 04/14/2019   Influenza-Unspecified 05/03/2007   PFIZER(Purple Top)SARS-COV-2 Vaccination 08/03/2019, 08/24/2019, 05/28/2020   Pneumococcal Conjugate-13 10/07/2017   Pneumococcal Polysaccharide-23 07/02/1998   Pneumococcal-Unspecified 07/02/1998    No results found for this or any previous visit (from the past 2160 hour(s)).  No results found.     All questions at time of visit were answered - patient instructed to contact office with any additional concerns or updates. ER/RTC precautions were reviewed with the patient as applicable.   Please note: manual typing as well as voice recognition software may have been used to produce this document - typos may escape review. Please contact Dr. Lyn Hollingshead for any needed clarifications.   Total encounter time on date of service, 03/21/21, was 30 minutes spent addressing problems/issues as noted above in Assessment & Plan, including time spent in discussion with patient regarding the HPI, ROS, confirming history, reviewing  Assessment & Plan, as well as time spent on coordination of care, record review.

## 2021-03-22 NOTE — Telephone Encounter (Signed)
Transition Care Management Follow-up Telephone Call Date of discharge and from where: 03/16/21 from [facility] How have you been since you were released from the hospital? Patient had OV 03/21/21 with Dr. Lyn Hollingshead. Any questions or concerns? No

## 2021-04-25 ENCOUNTER — Other Ambulatory Visit: Payer: Self-pay | Admitting: Osteopathic Medicine

## 2021-05-08 ENCOUNTER — Other Ambulatory Visit: Payer: Self-pay

## 2021-05-08 ENCOUNTER — Ambulatory Visit (INDEPENDENT_AMBULATORY_CARE_PROVIDER_SITE_OTHER): Payer: 59 | Admitting: Family Medicine

## 2021-05-08 ENCOUNTER — Encounter: Payer: Self-pay | Admitting: Family Medicine

## 2021-05-08 VITALS — BP 152/54 | HR 60 | Ht 64.0 in | Wt 139.0 lb

## 2021-05-08 DIAGNOSIS — I10 Essential (primary) hypertension: Secondary | ICD-10-CM

## 2021-05-08 DIAGNOSIS — I4892 Unspecified atrial flutter: Secondary | ICD-10-CM | POA: Diagnosis not present

## 2021-05-08 DIAGNOSIS — E039 Hypothyroidism, unspecified: Secondary | ICD-10-CM | POA: Diagnosis not present

## 2021-05-08 NOTE — Assessment & Plan Note (Signed)
Update TSH today. 

## 2021-05-08 NOTE — Patient Instructions (Addendum)
Very nice to meet you today.   Continue current medications.  See me again in 6 months.

## 2021-05-08 NOTE — Assessment & Plan Note (Signed)
Blood pressure is fairly well controlled for her age.  She will continue current medications.

## 2021-05-08 NOTE — Progress Notes (Signed)
Christina [name] - 87 y.o. female MRN [id number]  Date of birth: 19-Apr-1925  Subjective Chief Complaint  Patient presents with   Follow-up    HPI Christina [name] is a 87 year old female here today for follow-up visit.  This is her first visit with me as she was previously under the care of Dr. Lyn Hollingshead.  She is accompanied by her daughter today.  She has history of hypertension, atrial fibrillation/flutter, hypothyroidism.  She reports that overall she is doing well.  She did have a pacemaker placed approximately 2 months ago for sick sinus syndrome.  She has not had any chest pain, shortness of breath, fatigue or dizziness.  She is due to have updated TSH.  She denies any increased symptoms related to thyroid disorder.  ROS:  A comprehensive ROS was completed and negative except as noted per HPI  Allergies  Allergen Reactions   Elemental Sulfur Hives   Sulfa Antibiotics Rash, Other (See Comments) and Hives    unknown     Past Medical History:  Diagnosis Date   Hyperlipidemia    Hypertension    Hypothyroid     Past Surgical History:  Procedure Laterality Date   APPENDECTOMY     TONSILLECTOMY      Social History   Socioeconomic History   Marital status: Widowed    Spouse name: Not on file   Number of children: 1   Years of education: Not on file   Highest education level: Not on file  Occupational History   Not on file  Tobacco Use   Smoking status: Never   Smokeless tobacco: Never  Substance and Sexual Activity   Alcohol use: No   Drug use: No   Sexual activity: Never  Other Topics Concern   Not on file  Social History Narrative   Not on file   Social Determinants of Health   Financial Resource Strain: Not on file  Food Insecurity: Not on file  Transportation Needs: Not on file  Physical Activity: Not on file  Stress: Not on file  Social Connections: Not on file    Family History  Problem Relation Age of Onset   Healthy Mother    Angina Father     Health  Maintenance  Topic Date Due   Pneumonia Vaccine 2+ Years old (2 - PPSV23 if available, else PCV20) 10/08/2018   COVID-19 Vaccine (4 - Booster for Pfizer series) 07/23/2020   Zoster Vaccines- Shingrix (1 of 2) 06/20/2021 (Originally 03/22/1977)   INFLUENZA VACCINE  09/29/2021 (Originally 01/30/2021)   TETANUS/TDAP  02/02/2035 (Originally 03/22/1946)   DEXA SCAN  Completed   HPV VACCINES  Aged Out     ----------------------------------------------------------------------------------------------------------------------------------------------------------------------------------------------------------------- Physical Exam BP (!) 152/54 (BP Location: Right Arm, Patient Position: Sitting, Cuff Size: Normal)   Pulse 60   Ht 5\' 4"  (1.626 m)   Wt 139 lb (63 kg)   SpO2 96%   BMI 23.86 kg/m   Physical Exam Constitutional:      Appearance: Normal appearance.  HENT:     Head: Normocephalic and atraumatic.  Eyes:     General: No scleral icterus. Cardiovascular:     Rate and Rhythm: Normal rate and regular rhythm.  Pulmonary:     Effort: Pulmonary effort is normal.     Breath sounds: Normal breath sounds.  Musculoskeletal:     Cervical back: Neck supple.  Skin:    General: Skin is warm and dry.  Neurological:     General: No focal deficit present.  Mental Status: She is alert.  Psychiatric:        Mood and Affect: Mood normal.        Behavior: Behavior normal.    ------------------------------------------------------------------------------------------------------------------------------------------------------------------------------------------------------------------- Assessment and Plan  Essential hypertension, benign Blood pressure is fairly well controlled for her age.  She will continue current medications.  Paroxysmal atrial flutter (HCC) Rate control with metoprolol.  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  She will continue with current  medications including Eliquis for anticoagulation.  Hypothyroid Update TSH today.   No orders of the defined types were placed in this encounter.   Return in about 6 months (around 11/05/2021) for Hypothyroid.    This visit occurred during the SARS-CoV-2 public health emergency.  Safety protocols were in place, including screening questions prior to the visit, additional usage of staff PPE, and extensive cleaning of exam room while observing appropriate contact time as indicated for disinfecting solutions.

## 2021-05-08 NOTE — Assessment & Plan Note (Addendum)
Rate control with metoprolol.  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  She will continue with current medications including Eliquis for anticoagulation.

## 2021-05-09 LAB — TSH: TSH: 1.27 mIU/L (ref 0.40–4.50)

## 2021-10-23 ENCOUNTER — Other Ambulatory Visit: Payer: Self-pay | Admitting: Medical-Surgical

## 2021-11-06 ENCOUNTER — Ambulatory Visit: Payer: 59 | Admitting: Family Medicine

## 2021-12-06 ENCOUNTER — Ambulatory Visit (INDEPENDENT_AMBULATORY_CARE_PROVIDER_SITE_OTHER): Payer: 59 | Admitting: Family Medicine

## 2021-12-06 ENCOUNTER — Encounter: Payer: Self-pay | Admitting: Family Medicine

## 2021-12-06 VITALS — BP 151/57 | HR 60 | Ht 64.0 in | Wt 144.0 lb

## 2021-12-06 DIAGNOSIS — I10 Essential (primary) hypertension: Secondary | ICD-10-CM | POA: Diagnosis not present

## 2021-12-06 DIAGNOSIS — F419 Anxiety disorder, unspecified: Secondary | ICD-10-CM | POA: Diagnosis not present

## 2021-12-06 DIAGNOSIS — I4892 Unspecified atrial flutter: Secondary | ICD-10-CM

## 2021-12-06 DIAGNOSIS — E039 Hypothyroidism, unspecified: Secondary | ICD-10-CM

## 2021-12-06 NOTE — Assessment & Plan Note (Signed)
Her blood pressure is elevated today.  Reports readings at home are better controlled.  She will have assisted living facility check these and record over the next couple weeks.  For nausea continue lisinopril and metoprolol at current strength.

## 2021-12-06 NOTE — Progress Notes (Signed)
Advised daughter to have health care at [facility] to monitor BP and report 2 week findings.

## 2021-12-06 NOTE — Assessment & Plan Note (Signed)
She remains rate controlled with metoprolol at this time.  Denies any symptoms.  Additionally she is anticoagulated with Eliquis.  Updating labs today.

## 2021-12-06 NOTE — Assessment & Plan Note (Signed)
Updating TSH today. 

## 2021-12-06 NOTE — Assessment & Plan Note (Signed)
Stable with Lexapro at current strength.

## 2021-12-06 NOTE — Patient Instructions (Signed)
Great to see you today! Please continue current medications.  We'll be in touch with lab results  See me again in 6 months.

## 2021-12-06 NOTE — Progress Notes (Signed)
Christina [name] - 88 y.o. female MRN [id number]  Date of birth: Jan 18, 1925  Subjective Chief Complaint  Patient presents with   Annual Exam    HPI Christina [name] is a 88 year old female here today for a follow-up visit.  She reports that overall she is doing well.  She is accompanied by her daughter at this time.  She has noted some slightly diminished hearing over the past several months to years.  Has had problems with wax buildup in the past.  She does have history of paroxysmal atrial fibrillation as well as hypertension.  Her rate is controlled with metoprolol and she is additionally taking lisinopril for blood pressure.  Blood pressure is elevated today.  Reports that readings at home are better controlled.  She has not had any symptoms related to hypertension including chest pain, shortness of breath, palpitations, headaches or vision changes.  She does remain anticoagulated with apixaban and has not noted any significant bleeding.  Reports that she feels good with current dose of levothyroxine.  She is due to have updated blood work.  ROS:  A comprehensive ROS was completed and negative except as noted per HPI  Allergies  Allergen Reactions   Elemental Sulfur Hives   Sulfa Antibiotics Rash, Other (See Comments) and Hives    unknown     Past Medical History:  Diagnosis Date   Hyperlipidemia    Hypertension    Hypothyroid     Past Surgical History:  Procedure Laterality Date   APPENDECTOMY     TONSILLECTOMY      Social History   Socioeconomic History   Marital status: Widowed    Spouse name: Not on file   Number of children: 1   Years of education: Not on file   Highest education level: Not on file  Occupational History   Not on file  Tobacco Use   Smoking status: Never   Smokeless tobacco: Never  Substance and Sexual Activity   Alcohol use: No   Drug use: No   Sexual activity: Never  Other Topics Concern   Not on file  Social History Narrative   Not on file    Social Determinants of Health   Financial Resource Strain: Not on file  Food Insecurity: Not on file  Transportation Needs: Not on file  Physical Activity: Not on file  Stress: Not on file  Social Connections: Not on file    Family History  Problem Relation Age of Onset   Healthy Mother    Angina Father     Health Maintenance  Topic Date Due   COVID-19 Vaccine (4 - Booster for Pfizer series) 12/22/2021 (Originally 07/23/2020)   Zoster Vaccines- Shingrix (1 of 2) 03/08/2022 (Originally 03/22/1977)   TETANUS/TDAP  02/02/2035 (Originally 03/22/1946)   INFLUENZA VACCINE  01/30/2022   Pneumonia Vaccine 15+ Years old  Completed   DEXA SCAN  Completed   HPV VACCINES  Aged Out     ----------------------------------------------------------------------------------------------------------------------------------------------------------------------------------------------------------------- Physical Exam BP (!) 151/57 (BP Location: Right Arm, Patient Position: Sitting, Cuff Size: Normal)   Pulse 60   Ht 5\' 4"  (1.626 m)   Wt 144 lb (65.3 kg)   SpO2 95%   BMI 24.72 kg/m   Physical Exam Constitutional:      Appearance: Normal appearance.  HENT:     Head: Normocephalic and atraumatic.     Right Ear: Tympanic membrane normal.     Left Ear: Tympanic membrane normal.  Eyes:     General: No scleral icterus. Cardiovascular:  Rate and Rhythm: Normal rate and regular rhythm.  Pulmonary:     Effort: Pulmonary effort is normal.     Breath sounds: Normal breath sounds.  Musculoskeletal:     Cervical back: Neck supple.  Neurological:     Mental Status: She is alert.  Psychiatric:        Mood and Affect: Mood normal.        Behavior: Behavior normal.    ------------------------------------------------------------------------------------------------------------------------------------------------------------------------------------------------------------------- Assessment and  Plan  Paroxysmal atrial flutter (HCC) She remains rate controlled with metoprolol at this time.  Denies any symptoms.  Additionally she is anticoagulated with Eliquis.  Updating labs today.  Essential hypertension, benign Her blood pressure is elevated today.  Reports readings at home are better controlled.  She will have assisted living facility check these and record over the next couple weeks.  For nausea continue lisinopril and metoprolol at current strength.  Hypothyroid Updating TSH today.  Anxiety Stable with Lexapro at current strength.   No orders of the defined types were placed in this encounter.   No follow-ups on file.    This visit occurred during the SARS-CoV-2 public health emergency.  Safety protocols were in place, including screening questions prior to the visit, additional usage of staff PPE, and extensive cleaning of exam room while observing appropriate contact time as indicated for disinfecting solutions.

## 2021-12-07 LAB — COMPLETE METABOLIC PANEL WITH GFR
AG Ratio: 1.6 (calc) (ref 1.0–2.5)
ALT: 5 U/L — ABNORMAL LOW (ref 6–29)
AST: 12 U/L (ref 10–35)
Albumin: 4.2 g/dL (ref 3.6–5.1)
Alkaline phosphatase (APISO): 86 U/L (ref 37–153)
BUN: 18 mg/dL (ref 7–25)
CO2: 28 mmol/L (ref 20–32)
Calcium: 10.7 mg/dL — ABNORMAL HIGH (ref 8.6–10.4)
Chloride: 106 mmol/L (ref 98–110)
Creat: 0.72 mg/dL (ref 0.60–0.95)
Globulin: 2.7 g/dL (calc) (ref 1.9–3.7)
Glucose, Bld: 83 mg/dL (ref 65–99)
Potassium: 5 mmol/L (ref 3.5–5.3)
Sodium: 141 mmol/L (ref 135–146)
Total Bilirubin: 0.5 mg/dL (ref 0.2–1.2)
Total Protein: 6.9 g/dL (ref 6.1–8.1)
eGFR: 77 mL/min/{1.73_m2} (ref >=60)

## 2021-12-07 LAB — CBC WITH DIFFERENTIAL/PLATELET
Absolute Monocytes: 425 cells/uL (ref 200–950)
Basophils Absolute: 18 cells/uL (ref 0–200)
Basophils Relative: 0.3 %
Eosinophils Absolute: 100 cells/uL (ref 15–500)
Eosinophils Relative: 1.7 %
HCT: 39.2 % (ref 35.0–45.0)
Hemoglobin: 13.1 g/dL (ref 11.7–15.5)
Lymphs Abs: 1233 cells/uL (ref 850–3900)
MCH: 29.8 pg (ref 27.0–33.0)
MCHC: 33.4 g/dL (ref 32.0–36.0)
MCV: 89.3 fL (ref 80.0–100.0)
MPV: 10.4 fL (ref 7.5–12.5)
Monocytes Relative: 7.2 %
Neutro Abs: 4124 cells/uL (ref 1500–7800)
Neutrophils Relative %: 69.9 %
Platelets: 207 10*3/uL (ref 140–400)
RBC: 4.39 10*6/uL (ref 3.80–5.10)
RDW: 13.2 % (ref 11.0–15.0)
Total Lymphocyte: 20.9 %
WBC: 5.9 10*3/uL (ref 3.8–10.8)

## 2021-12-07 LAB — TSH: TSH: 1.49 mIU/L (ref 0.40–4.50)

## 2022-02-05 ENCOUNTER — Other Ambulatory Visit: Payer: Self-pay | Admitting: Osteopathic Medicine

## 2022-02-26 ENCOUNTER — Other Ambulatory Visit: Payer: Self-pay | Admitting: Osteopathic Medicine

## 2022-02-26 DIAGNOSIS — F411 Generalized anxiety disorder: Secondary | ICD-10-CM

## 2022-02-26 DIAGNOSIS — R4189 Other symptoms and signs involving cognitive functions and awareness: Secondary | ICD-10-CM

## 2022-02-26 DIAGNOSIS — I10 Essential (primary) hypertension: Secondary | ICD-10-CM

## 2022-03-02 ENCOUNTER — Other Ambulatory Visit: Payer: Self-pay

## 2022-03-02 MED ORDER — LISINOPRIL 10 MG PO TABS: ORAL_TABLET | ORAL | 3 refills | Status: DC

## 2022-03-02 MED ORDER — ESCITALOPRAM OXALATE 5 MG PO TABS: 5.0000 mg | ORAL_TABLET | Freq: Every day | ORAL | 3 refills | Status: DC

## 2022-08-06 ENCOUNTER — Other Ambulatory Visit: Payer: Self-pay | Admitting: Family Medicine

## 2022-08-07 ENCOUNTER — Encounter: Payer: Self-pay | Admitting: Family Medicine

## 2022-08-07 ENCOUNTER — Ambulatory Visit (INDEPENDENT_AMBULATORY_CARE_PROVIDER_SITE_OTHER): Payer: 59 | Admitting: Family Medicine

## 2022-08-07 VITALS — BP 149/55 | HR 69 | Ht 64.0 in | Wt 131.0 lb

## 2022-08-07 DIAGNOSIS — R21 Rash and other nonspecific skin eruption: Secondary | ICD-10-CM | POA: Diagnosis not present

## 2022-08-07 DIAGNOSIS — H02401 Unspecified ptosis of right eyelid: Secondary | ICD-10-CM

## 2022-08-07 DIAGNOSIS — I4892 Unspecified atrial flutter: Secondary | ICD-10-CM | POA: Diagnosis not present

## 2022-08-07 MED ORDER — TRIAMCINOLONE ACETONIDE 0.1 % EX CREA: 1.0000 | TOPICAL_CREAM | Freq: Two times a day (BID) | CUTANEOUS | 0 refills | Status: AC

## 2022-08-07 MED ORDER — TRIAMCINOLONE ACETONIDE 0.1 % EX CREA: 1.0000 | TOPICAL_CREAM | Freq: Two times a day (BID) | CUTANEOUS | 0 refills | Status: DC

## 2022-08-07 NOTE — Patient Instructions (Signed)
Try triamcinolone on area of neck.  Let me know if not improving.

## 2022-08-07 NOTE — Assessment & Plan Note (Signed)
Mild droop of R upper lid.  Does not affect vision.  No other neurological changes.  Will continue to monitor.

## 2022-08-07 NOTE — Assessment & Plan Note (Signed)
Rash on neck appears more eczematous in nature.  Adding topical triamcinolone.  F/u if not improving.

## 2022-08-07 NOTE — Progress Notes (Signed)
Christina [name] - 89 y.o. female MRN [id number]  Date of birth: 12-21-1924  Subjective Chief Complaint  Patient presents with   Facial Droop    HPI Christina [name] is a 89 y.o. female here today for eyelid droop and rash  She has a history of A. Fib and is on metoprolol and eliquis.  Pacemaker placed for SSS.  This has been managed by her cardiologist. She denies symptoms related to this at this time.  Rate remains controlled.  She is also on lisinopril for HTN management.   Has noticed eye droop of the R eye. Located on the outer portion of the lid of the eye.  Doesn't really bother her.  Denies vision changes.  No other neurological changes associated with this.    Remains on low strength levothyroxine for management of hypothyroidism. Her TSH has remained stable.   She does have a spot on her neck that has been getting larger.  Area is reddened and itchy.  Denies bleeding or pain.   ROS:  A comprehensive ROS was completed and negative except as noted per HPI    Allergies  Allergen Reactions   Elemental Sulfur Hives   Sulfa Antibiotics Rash, Other (See Comments) and Hives    unknown     Past Medical History:  Diagnosis Date   Hyperlipidemia    Hypertension    Hypothyroid     Past Surgical History:  Procedure Laterality Date   APPENDECTOMY     TONSILLECTOMY      Social History   Socioeconomic History   Marital status: Widowed    Spouse name: Not on file   Number of children: 1   Years of education: Not on file   Highest education level: Not on file  Occupational History   Not on file  Tobacco Use   Smoking status: Never   Smokeless tobacco: Never  Substance and Sexual Activity   Alcohol use: No   Drug use: No   Sexual activity: Never  Other Topics Concern   Not on file  Social History Narrative   Not on file   Social Determinants of Health   Financial Resource Strain: Not on file  Food Insecurity: Not on file  Transportation Needs: Not on file  Physical  Activity: Not on file  Stress: Not on file  Social Connections: Not on file    Family History  Problem Relation Age of Onset   Healthy Mother    Angina Father     Health Maintenance  Topic Date Due   DTaP/Tdap/Td (1 - Tdap) Never done   Zoster Vaccines- Shingrix (1 of 2) 11/05/2022 (Originally 03/22/1977)   Pneumonia Vaccine 71+ Years old  Completed   INFLUENZA VACCINE  Completed   DEXA SCAN  Completed   COVID-19 Vaccine  Completed   HPV VACCINES  Aged Out     ----------------------------------------------------------------------------------------------------------------------------------------------------------------------------------------------------------------- Physical Exam BP (!) 149/55 (BP Location: Left Arm, Patient Position: Sitting, Cuff Size: Normal)   Pulse 69   Ht 5\' 4"  (1.626 m)   Wt 131 lb (59.4 kg)   SpO2 96%   BMI 22.49 kg/m   Physical Exam Constitutional:      Appearance: Normal appearance.  HENT:     Head: Normocephalic and atraumatic.  Eyes:     General: No scleral icterus.    Extraocular Movements: Extraocular movements intact.     Pupils: Pupils are equal, round, and reactive to light.     Comments: No significant Ptosis noted.  Mild eyelid  sag in the corner of they R eye.    Skin:    Comments: Dry, excoriated are along r side of neck.    Neurological:     Mental Status: She is alert.  Psychiatric:        Mood and Affect: Mood normal.        Behavior: Behavior normal.     ------------------------------------------------------------------------------------------------------------------------------------------------------------------------------------------------------------------- Assessment and Plan  Paroxysmal atrial flutter (HCC) Stable with current medications.  Rate controlled and on anticoagulation.    Drooping eyelid, right Mild droop of R upper lid.  Does not affect vision.  No other neurological changes.  Will continue to  monitor.    Rash Rash on neck appears more eczematous in nature.  Adding topical triamcinolone.  F/u if not improving.    Meds ordered this encounter  Medications   DISCONTD: triamcinolone cream (KENALOG) 0.1 %    Sig: Apply 1 Application topically 2 (two) times daily.    Dispense:  30 g    Refill:  0   DISCONTD: triamcinolone cream (KENALOG) 0.1 %    Sig: Apply 1 Application topically 2 (two) times daily.    Dispense:  30 g    Refill:  0   triamcinolone cream (KENALOG) 0.1 %    Sig: Apply 1 Application topically 2 (two) times daily.    Dispense:  30 g    Refill:  0    No follow-ups on file.    This visit occurred during the SARS-CoV-2 public health emergency.  Safety protocols were in place, including screening questions prior to the visit, additional usage of staff PPE, and extensive cleaning of exam room while observing appropriate contact time as indicated for disinfecting solutions.

## 2022-08-07 NOTE — Assessment & Plan Note (Signed)
Stable with current medications.  Rate controlled and on anticoagulation.

## 2022-10-17 ENCOUNTER — Other Ambulatory Visit: Payer: Self-pay | Admitting: Family Medicine

## 2022-10-17 DIAGNOSIS — M810 Age-related osteoporosis without current pathological fracture: Secondary | ICD-10-CM

## 2022-10-17 DIAGNOSIS — M170 Bilateral primary osteoarthritis of knee: Secondary | ICD-10-CM

## 2023-01-14 ENCOUNTER — Telehealth: Payer: Self-pay | Admitting: Family Medicine

## 2023-01-14 DIAGNOSIS — E039 Hypothyroidism, unspecified: Secondary | ICD-10-CM

## 2023-01-14 DIAGNOSIS — Z0000 Encounter for general adult medical examination without abnormal findings: Secondary | ICD-10-CM

## 2023-01-14 DIAGNOSIS — I4891 Unspecified atrial fibrillation: Secondary | ICD-10-CM

## 2023-01-14 DIAGNOSIS — I10 Essential (primary) hypertension: Secondary | ICD-10-CM

## 2023-01-14 NOTE — Telephone Encounter (Signed)
Orders entered.   CM

## 2023-01-14 NOTE — Telephone Encounter (Signed)
Ms. Christell Constant called. Requesting lab order a week before pt's appt on 7/31.

## 2023-01-24 LAB — CBC WITH DIFFERENTIAL/PLATELET
Basophils Absolute: 0 10*3/uL (ref 0.0–0.2)
Basos: 1 %
Eos: 2 %
Hematocrit: 39.9 % (ref 34.0–46.6)
Hemoglobin: 12.8 g/dL (ref 11.1–15.9)
Immature Grans (Abs): 0 10*3/uL (ref 0.0–0.1)
Immature Granulocytes: 0 %
Lymphocytes Absolute: 1.1 10*3/uL (ref 0.7–3.1)
Lymphs: 27 %
MCH: 29.9 pg (ref 26.6–33.0)
MCHC: 32.1 g/dL (ref 31.5–35.7)
MCV: 93 fL (ref 79–97)
Monocytes: 7 %
Neutrophils Absolute: 2.5 10*3/uL (ref 1.4–7.0)
Neutrophils: 63 %
RBC: 4.28 x10E6/uL (ref 3.77–5.28)
RDW: 14.5 % (ref 11.7–15.4)
WBC: 4 10*3/uL (ref 3.4–10.8)

## 2023-01-24 LAB — CMP14+EGFR
ALT: 7 IU/L (ref 0–32)
Alkaline Phosphatase: 112 IU/L (ref 44–121)
BUN/Creatinine Ratio: 16 (ref 12–28)
BUN: 10 mg/dL (ref 10–36)
Bilirubin Total: 0.4 mg/dL (ref 0.0–1.2)
Calcium: 10.1 mg/dL (ref 8.7–10.3)
Chloride: 106 mmol/L (ref 96–106)
Globulin, Total: 2.6 g/dL (ref 1.5–4.5)
Glucose: 93 mg/dL (ref 70–99)
Potassium: 4 mmol/L (ref 3.5–5.2)
Sodium: 142 mmol/L (ref 134–144)
Total Protein: 6.8 g/dL (ref 6.0–8.5)
eGFR: 82 mL/min/{1.73_m2} (ref >59)

## 2023-01-30 ENCOUNTER — Ambulatory Visit: Payer: 59 | Admitting: Family Medicine

## 2023-01-30 ENCOUNTER — Encounter: Payer: Self-pay | Admitting: Family Medicine

## 2023-01-30 VITALS — BP 176/55 | HR 60 | Ht 64.0 in | Wt 149.0 lb

## 2023-01-30 DIAGNOSIS — E039 Hypothyroidism, unspecified: Secondary | ICD-10-CM

## 2023-01-30 DIAGNOSIS — F419 Anxiety disorder, unspecified: Secondary | ICD-10-CM

## 2023-01-30 DIAGNOSIS — M810 Age-related osteoporosis without current pathological fracture: Secondary | ICD-10-CM

## 2023-01-30 DIAGNOSIS — M170 Bilateral primary osteoarthritis of knee: Secondary | ICD-10-CM

## 2023-01-30 DIAGNOSIS — I10 Essential (primary) hypertension: Secondary | ICD-10-CM | POA: Diagnosis not present

## 2023-01-30 DIAGNOSIS — F411 Generalized anxiety disorder: Secondary | ICD-10-CM

## 2023-01-30 DIAGNOSIS — D485 Neoplasm of uncertain behavior of skin: Secondary | ICD-10-CM

## 2023-01-30 DIAGNOSIS — R4189 Other symptoms and signs involving cognitive functions and awareness: Secondary | ICD-10-CM

## 2023-01-30 DIAGNOSIS — I4892 Unspecified atrial flutter: Secondary | ICD-10-CM

## 2023-01-30 MED ORDER — ESCITALOPRAM OXALATE 5 MG PO TABS: 5.0000 mg | ORAL_TABLET | Freq: Every day | ORAL | 3 refills | Status: DC

## 2023-01-30 MED ORDER — ALENDRONATE SODIUM 10 MG PO TABS: 10.0000 mg | ORAL_TABLET | Freq: Every day | ORAL | 3 refills | Status: DC

## 2023-01-30 MED ORDER — LISINOPRIL 10 MG PO TABS: ORAL_TABLET | ORAL | 3 refills | Status: DC

## 2023-01-30 MED ORDER — METOPROLOL SUCCINATE ER 100 MG PO TB24: 100.0000 mg | ORAL_TABLET | Freq: Every day | ORAL | 3 refills | Status: DC

## 2023-01-30 MED ORDER — LEVOTHYROXINE SODIUM 25 MCG PO TABS: 25.0000 ug | ORAL_TABLET | Freq: Every day | ORAL | 3 refills | Status: DC

## 2023-01-30 NOTE — Assessment & Plan Note (Signed)
TSH normal recently.  Continue levothyroxine at current strength.

## 2023-01-30 NOTE — Assessment & Plan Note (Signed)
Blood pressure is elevated today in clinic.  Recommend that they monitor readings at home to see if this correlates with readings here in the clinic.  If blood pressure mains elevated will plan to increase lisinopril to 20 mg.

## 2023-01-30 NOTE — Assessment & Plan Note (Signed)
Follows with cardiology.  Remains on metoprolol.  She will continue this at current strength for rate control.  She remains anticoagulated with Eliquis.

## 2023-01-30 NOTE — Assessment & Plan Note (Signed)
Referral placed to dermatology

## 2023-01-30 NOTE — Assessment & Plan Note (Signed)
Continue Lexapro at current strength. 

## 2023-01-30 NOTE — Assessment & Plan Note (Signed)
She remains on Fosamax.  She declines additional bone density testing.

## 2023-01-30 NOTE — Progress Notes (Signed)
They should have 1 regular person out there for reference Christina [name] - 89 y.o. female MRN [id number]  Date of birth: 07-07-1924  Subjective Chief Complaint  Patient presents with   Hypertension    HPI Christina [name] is a 89 y.o. female here today for follow up visit.   She has history of A. Fib as well as SSS.  She is s/p pacemaker and continues to see cardiology on a regular basis.  Her rate remains controlled.  She is on lisinopril for management of HTN.  Her BP is elevated on initial check today.  Remains on eliquis for anticoagulation.  Renal function and H&H look ok at this time.  She does not monitor blood pressure at home.  Remains on levothyroxine for hypothyroidism.  Recent labs indicate that this is well controlled.   She does have some skin lesions she is concerned about.  She has an area on the right temple as well as an area on the right neck.   She remains on lexapro which seems to work well for mood.   ROS:  A comprehensive ROS was completed and negative except as noted per HPI  Allergies  Allergen Reactions   Elemental Sulfur Hives   Sulfa Antibiotics Rash, Other (See Comments) and Hives    unknown     Past Medical History:  Diagnosis Date   Hyperlipidemia    Hypertension    Hypothyroid     Past Surgical History:  Procedure Laterality Date   APPENDECTOMY     TONSILLECTOMY      Social History   Socioeconomic History   Marital status: Widowed    Spouse name: Not on file   Number of children: 1   Years of education: Not on file   Highest education level: Not on file  Occupational History   Not on file  Tobacco Use   Smoking status: Never   Smokeless tobacco: Never  Substance and Sexual Activity   Alcohol use: No   Drug use: No   Sexual activity: Never  Other Topics Concern   Not on file  Social History Narrative   Not on file   Social Determinants of Health   Financial Resource Strain: Low Risk  (03/14/2021)   Received from [facility],  [facility]   Overall Financial Resource Strain (CARDIA)    Difficulty of Paying Living Expenses: Not hard at all  Food Insecurity: No Food Insecurity (07/24/2021)   Received from [facility], [facility]   Hunger Vital Sign    Worried About Running Out of Food in the Last Year: Never true    Ran Out of Food in the Last Year: Never true  Transportation Needs: No Transportation Needs (03/15/2021)   Received from [facility], [facility]   PRAPARE - Transportation    Lack of Transportation (Medical): No    Lack of Transportation (Non-Medical): No  Physical Activity: Not on file  Stress: No Stress Concern Present (03/14/2021)   Received from [facility], [facility] - Occupational Stress Questionnaire    Feeling of Stress : Not at all  Social Connections: Unknown (11/01/2021)   Received from [facility], [facility]   Social Network    Social Network: Not on file    Family History  Problem Relation Age of Onset   Healthy Mother    Angina Father     Health Maintenance  Topic Date Due   DTaP/Tdap/Td (1 - Tdap) Never done  Zoster Vaccines- Shingrix (1 of 2) Never done   COVID-19 Vaccine (5 - 2023-24 season) 09/19/2022   INFLUENZA VACCINE  01/31/2023   Pneumonia Vaccine 15+ Years old  Completed   DEXA SCAN  Completed   HPV VACCINES  Aged Out     ----------------------------------------------------------------------------------------------------------------------------------------------------------------------------------------------------------------- Physical Exam BP (!) 176/55 (BP Location: Right Arm, Patient Position: Sitting, Cuff Size: Normal)   Pulse 60   Ht 5\' 4"  (1.626 m)   Wt 149 lb (67.6 kg)   SpO2 96%   BMI 25.58 kg/m   Physical Exam Constitutional:      Appearance: Normal appearance.  Eyes:     General: No scleral icterus. Cardiovascular:     Rate and Rhythm: Normal rate and regular rhythm.   Pulmonary:     Effort: Pulmonary effort is normal.     Breath sounds: Normal breath sounds.  Skin:    Comments: Scabbed over area to right temple.   She has a erythematous patch of the right neck.  Neurological:     General: No focal deficit present.     Mental Status: She is alert.  Psychiatric:        Mood and Affect: Mood normal.        Behavior: Behavior normal.     ------------------------------------------------------------------------------------------------------------------------------------------------------------------------------------------------------------------- Assessment and Plan  Essential hypertension, benign Blood pressure is elevated today in clinic.  Recommend that they monitor readings at home to see if this correlates with readings here in the clinic.  If blood pressure mains elevated will plan to increase lisinopril to 20 mg.  Paroxysmal atrial flutter (HCC) Follows with cardiology.  Remains on metoprolol.  She will continue this at current strength for rate control.  She remains anticoagulated with Eliquis.  Hypothyroid TSH normal recently.  Continue levothyroxine at current strength.  Anxiety Continue Lexapro at current strength.  Osteoporosis She remains on Fosamax.  She declines additional bone density testing.  Neoplasm of uncertain behavior of skin Referral placed to dermatology.   Meds ordered this encounter  Medications   alendronate (FOSAMAX) 10 MG tablet    Sig: Take 1 tablet (10 mg total) by mouth daily before breakfast. Take with a full glass of water on an empty stomach.    Dispense:  90 tablet    Refill:  3   escitalopram (LEXAPRO) 5 MG tablet    Sig: Take 1 tablet (5 mg total) by mouth daily.    Dispense:  90 tablet    Refill:  3   levothyroxine (SYNTHROID) 25 MCG tablet    Sig: Take 1 tablet (25 mcg total) by mouth daily before breakfast.    Dispense:  90 tablet    Refill:  3   lisinopril (ZESTRIL) 10 MG tablet    Sig:  TAKE 1 TABLET DAILY    Dispense:  90 tablet    Refill:  3   metoprolol succinate (TOPROL-XL) 100 MG 24 hr tablet    Sig: Take 1 tablet (100 mg total) by mouth daily.    Dispense:  90 tablet    Refill:  3    Return in about 1 year (around 01/30/2024) for F/u HTN/Thyroid.    This visit occurred during the SARS-CoV-2 public health emergency.  Safety protocols were in place, including screening questions prior to the visit, additional usage of staff PPE, and extensive cleaning of exam room while observing appropriate contact time as indicated for disinfecting solutions.

## 2023-02-08 ENCOUNTER — Telehealth: Payer: Self-pay | Admitting: Family Medicine

## 2023-02-08 NOTE — Telephone Encounter (Signed)
Numbers look better with the change.  I would rather have her run a little high than get too low.

## 2023-02-08 NOTE — Telephone Encounter (Signed)
Christina [name] called in this afternoon in reference to a referral sent [facility]. She stated that they are not covered by [employer]. She has a place that she would like the referral sent to. Can you please call her at [phone].

## 2023-02-08 NOTE — Telephone Encounter (Signed)
Christina [name], [id number], called in to give Christina [name]'s Blood pressures for the week.   8/1 @ 650a: 196/77 8/2 @ 650a: 199/80 8/3 @ 650a: 190/76  Switched to taking BP after breakfast and medication  8/4 @ 930a: 180/70 8/5 @ 945a: 165/72 8/6 @ 940a: 168/69 8/7 @ 920a: 174/66 8/8 @ 950a: 161/62 8/9 @ 1015a:152/65

## 2023-02-18 ENCOUNTER — Telehealth: Payer: Self-pay | Admitting: Family Medicine

## 2023-02-18 NOTE — Telephone Encounter (Signed)
Patient's daughter called in stated that the referral for Dermatology was sent to an office that is out of network. States that the patient has Christina [name], who covers [place].   [facility] [address] [place], [place] [phone]

## 2023-02-19 ENCOUNTER — Telehealth: Payer: Self-pay

## 2023-02-19 NOTE — Telephone Encounter (Signed)
Patient caregiver - carolyn states that [facility] not accept her insurance - [employer] and they are requesting that referral be resent to someone who does accept her insurance.

## 2023-02-19 NOTE — Telephone Encounter (Signed)
Patient caregiver informed

## 2023-02-21 NOTE — Telephone Encounter (Signed)
Referral, clinical notes, demographics, and copies of insurance cards have been faxed to [facility] at [phone]. Office will contact patient to schedule referral appointment.    [facility] [address] [place], [place] [address]   Ph: [phone]

## 2023-03-06 ENCOUNTER — Telehealth: Payer: Self-pay | Admitting: Family Medicine

## 2023-03-06 NOTE — Telephone Encounter (Signed)
error 

## 2024-01-28 ENCOUNTER — Other Ambulatory Visit: Payer: Self-pay | Admitting: Family Medicine

## 2024-01-28 DIAGNOSIS — I10 Essential (primary) hypertension: Secondary | ICD-10-CM

## 2024-01-28 DIAGNOSIS — R4189 Other symptoms and signs involving cognitive functions and awareness: Secondary | ICD-10-CM

## 2024-01-28 DIAGNOSIS — F411 Generalized anxiety disorder: Secondary | ICD-10-CM

## 2024-01-29 NOTE — Telephone Encounter (Signed)
 Called daughter left vm to call back and schedule an physcial and med refills

## 2024-01-29 NOTE — Telephone Encounter (Signed)
 Pls contact pt's daughter to schedule for yearly appt and medication refills. Sending 30 day med refills to hold. Thx.

## 2024-02-06 ENCOUNTER — Other Ambulatory Visit: Payer: Self-pay | Admitting: Family Medicine

## 2024-02-06 DIAGNOSIS — I10 Essential (primary) hypertension: Secondary | ICD-10-CM

## 2024-02-06 NOTE — Telephone Encounter (Signed)
 Contact pt or family to schedule appt with Dr. Alvia. Last seen 01/30/2023. Updated labs needed.

## 2024-02-18 ENCOUNTER — Encounter: Payer: Self-pay | Admitting: Family Medicine

## 2024-02-18 ENCOUNTER — Ambulatory Visit: Admitting: Family Medicine

## 2024-02-18 VITALS — BP 141/57 | HR 60 | Ht 64.0 in | Wt 146.0 lb

## 2024-02-18 DIAGNOSIS — I10 Essential (primary) hypertension: Secondary | ICD-10-CM

## 2024-02-18 DIAGNOSIS — E039 Hypothyroidism, unspecified: Secondary | ICD-10-CM

## 2024-02-18 DIAGNOSIS — C4491 Basal cell carcinoma of skin, unspecified: Secondary | ICD-10-CM | POA: Insufficient documentation

## 2024-02-18 DIAGNOSIS — M810 Age-related osteoporosis without current pathological fracture: Secondary | ICD-10-CM | POA: Diagnosis not present

## 2024-02-18 DIAGNOSIS — M170 Bilateral primary osteoarthritis of knee: Secondary | ICD-10-CM

## 2024-02-18 DIAGNOSIS — F411 Generalized anxiety disorder: Secondary | ICD-10-CM | POA: Diagnosis not present

## 2024-02-18 DIAGNOSIS — R4189 Other symptoms and signs involving cognitive functions and awareness: Secondary | ICD-10-CM | POA: Insufficient documentation

## 2024-02-18 MED ORDER — ESCITALOPRAM OXALATE 5 MG PO TABS: 5.0000 mg | ORAL_TABLET | Freq: Every day | ORAL | 2 refills | Status: AC

## 2024-02-18 MED ORDER — ESCITALOPRAM OXALATE 5 MG PO TABS
5.0000 mg | ORAL_TABLET | Freq: Every day | ORAL | 2 refills | Status: DC
Start: 2024-02-18 — End: 2024-02-18

## 2024-02-18 MED ORDER — ALENDRONATE SODIUM 10 MG PO TABS
10.0000 mg | ORAL_TABLET | Freq: Every day | ORAL | 3 refills | Status: DC
Start: 2024-02-18 — End: 2024-02-18

## 2024-02-18 MED ORDER — METOPROLOL SUCCINATE ER 100 MG PO TB24
100.0000 mg | ORAL_TABLET | Freq: Every day | ORAL | 2 refills | Status: DC
Start: 2024-02-18 — End: 2024-02-18

## 2024-02-18 MED ORDER — LEVOTHYROXINE SODIUM 25 MCG PO TABS: 25.0000 ug | ORAL_TABLET | Freq: Every day | ORAL | 2 refills | Status: AC

## 2024-02-18 MED ORDER — ALENDRONATE SODIUM 10 MG PO TABS: 10.0000 mg | ORAL_TABLET | Freq: Every day | ORAL | 3 refills | Status: AC

## 2024-02-18 MED ORDER — LISINOPRIL 10 MG PO TABS: ORAL_TABLET | ORAL | 3 refills | Status: AC

## 2024-02-18 MED ORDER — METOPROLOL SUCCINATE ER 100 MG PO TB24: 100.0000 mg | ORAL_TABLET | Freq: Every day | ORAL | 2 refills | Status: AC

## 2024-02-18 MED ORDER — LISINOPRIL 10 MG PO TABS
ORAL_TABLET | ORAL | 3 refills | Status: DC
Start: 2024-02-18 — End: 2024-02-18

## 2024-02-18 MED ORDER — LEVOTHYROXINE SODIUM 25 MCG PO TABS: 25.0000 ug | ORAL_TABLET | Freq: Every day | ORAL | 2 refills | Status: DC

## 2024-02-18 NOTE — Assessment & Plan Note (Signed)
 Blood pressure is fairly well-controlled for age and other comorbidities.  Continue current medications.

## 2024-02-18 NOTE — Assessment & Plan Note (Signed)
 Update TSH

## 2024-02-18 NOTE — Assessment & Plan Note (Signed)
 Referral placed to dermatology

## 2024-02-18 NOTE — Progress Notes (Signed)
 Christina [name] - 89 y.o. female MRN [id number]  Date of birth: [birth year]  Subjective Chief Complaint  Patient presents with   Hypertension    HPI Christina [name] is a 89 y.o. female here today for follow up viisit.    She reports that she is doing pretty well.  Has small nodule on R arm.  Doesn't really bother her.  Denies pain.  Has not really changed in size.   Previously has had BCC removed for forehead. Has lesion that she wants checked out around same place.   Remains on lisinopril  and metoprolol  for management of HTN.  BP is fairly well controlled.  History of A. Fib as well and rate is controlled.  Remains anticoagulated with Eliquis .  Denies chest pain, shortness of breath, palpitations, headache or vision changes.   Doing well with fosamax  for osteoporosis.    ROS:  A comprehensive ROS was completed and negative except as noted per HPI   Allergies  Allergen Reactions   Elemental Sulfur Hives   Sulfa Antibiotics Rash, Other (See Comments) and Hives    unknown     Past Medical History:  Diagnosis Date   Hyperlipidemia    Hypertension    Hypothyroid     Past Surgical History:  Procedure Laterality Date   APPENDECTOMY     TONSILLECTOMY      Social History   Socioeconomic History   Marital status: Widowed    Spouse name: Not on file   Number of children: 1   Years of education: Not on file   Highest education level: Not on file  Occupational History   Not on file  Tobacco Use   Smoking status: Never   Smokeless tobacco: Never  Substance and Sexual Activity   Alcohol use: No   Drug use: No   Sexual activity: Never  Other Topics Concern   Not on file  Social History Narrative   Not on file   Social Drivers of Health   Financial Resource Strain: Low Risk  (03/14/2021)   Received from [facility]   Overall Financial Resource Strain (CARDIA)    Difficulty of Paying Living Expenses: Not hard at all  Food Insecurity: No Food Insecurity (07/24/2021)    Received from [facility]   Hunger Vital Sign    Within the past 12 months, you worried that your food would run out before you got the money to buy more.: Never true    Within the past 12 months, the food you bought just didn't last and you didn't have money to get more.: Never true  Transportation Needs: No Transportation Needs (03/15/2021)   Received from [facility] - Transportation    Lack of Transportation (Medical): No    Lack of Transportation (Non-Medical): No  Physical Activity: Not on file  Stress: No Stress Concern Present (03/14/2021)   Received from [facility] - Occupational Stress Questionnaire    Feeling of Stress : Not at all  Social Connections: Unknown (11/01/2021)   Received from [facility]   Social Network    Social Network: Not on file    Family History  Problem Relation Age of Onset   Healthy Mother    Angina Father     Health Maintenance  Topic Date Due   DTaP/Tdap/Td (1 - Tdap) Never done   Zoster Vaccines- Shingrix (1 of 2) Never done   COVID-19 Vaccine (5 - 2024-25 season) 03/03/2023   INFLUENZA  VACCINE  01/31/2024   Pneumococcal Vaccine: 50+ Years  Completed   DEXA SCAN  Completed   HPV VACCINES  Aged Out   Meningococcal B Vaccine  Aged Out   Pneumococcal Vaccine  Discontinued     ----------------------------------------------------------------------------------------------------------------------------------------------------------------------------------------------------------------- Physical Exam BP (!) 141/57   Pulse 60   Ht 5' 4 (1.626 m)   Wt 146 lb (66.2 kg)   SpO2 95%   BMI 25.06 kg/m   Physical Exam Constitutional:      Appearance: Normal appearance.  Cardiovascular:     Rate and Rhythm: Normal rate and regular rhythm.  Pulmonary:     Effort: Pulmonary effort is normal.     Breath sounds: Normal breath sounds.  Musculoskeletal:     Cervical back: Neck supple.   Skin:    Comments: Slightly raised erythematous lesion to left forehead area.    Raised, rough/waxy pigmented lesion on L temporal area.   Neurological:     Mental Status: She is alert.  Psychiatric:        Mood and Affect: Mood normal.        Behavior: Behavior normal.     ------------------------------------------------------------------------------------------------------------------------------------------------------------------------------------------------------------------- Assessment and Plan  Essential hypertension, benign Blood pressure is fairly well-controlled for age and other comorbidities.  Continue current medications.  Osteoporosis Continue Fosamax  at current strength.  Hypothyroid Update TSH.  Basal cell carcinoma (BCC) Referral placed to dermatology.  Behavior related to cognitive impairment Continue Lexapro  at current strength.   Meds ordered this encounter  Medications   DISCONTD: alendronate  (FOSAMAX ) 10 MG tablet    Sig: Take 1 tablet (10 mg total) by mouth daily before breakfast. Take with a full glass of water on an empty stomach.    Dispense:  90 tablet    Refill:  3   DISCONTD: escitalopram  (LEXAPRO ) 5 MG tablet    Sig: Take 1 tablet (5 mg total) by mouth daily.    Dispense:  90 tablet    Refill:  2   DISCONTD: levothyroxine  (SYNTHROID ) 25 MCG tablet    Sig: Take 1 tablet (25 mcg total) by mouth daily before breakfast.    Dispense:  90 tablet    Refill:  2   DISCONTD: lisinopril  (ZESTRIL ) 10 MG tablet    Sig: TAKE 1 TABLET DAILY    Dispense:  90 tablet    Refill:  3   DISCONTD: metoprolol  succinate (TOPROL -XL) 100 MG 24 hr tablet    Sig: Take 1 tablet (100 mg total) by mouth daily.    Dispense:  90 tablet    Refill:  2   alendronate  (FOSAMAX ) 10 MG tablet    Sig: Take 1 tablet (10 mg total) by mouth daily before breakfast. Take with a full glass of water on an empty stomach.    Dispense:  90 tablet    Refill:  3   escitalopram   (LEXAPRO ) 5 MG tablet    Sig: Take 1 tablet (5 mg total) by mouth daily.    Dispense:  90 tablet    Refill:  2   levothyroxine  (SYNTHROID ) 25 MCG tablet    Sig: Take 1 tablet (25 mcg total) by mouth daily before breakfast.    Dispense:  90 tablet    Refill:  2   lisinopril  (ZESTRIL ) 10 MG tablet    Sig: TAKE 1 TABLET DAILY    Dispense:  90 tablet    Refill:  3   metoprolol  succinate (TOPROL -XL) 100 MG 24 hr tablet    Sig: Take  1 tablet (100 mg total) by mouth daily.    Dispense:  90 tablet    Refill:  2    Return in about 6 months (around 08/20/2024) for Hypertension.

## 2024-02-18 NOTE — Assessment & Plan Note (Signed)
 Continue Fosamax  at current strength.

## 2024-02-18 NOTE — Assessment & Plan Note (Signed)
Continue Lexapro at current strength. 

## 2024-02-19 LAB — CBC WITH DIFFERENTIAL/PLATELET
Basophils Absolute: 0 x10E3/uL (ref 0.0–0.2)
Basos: 1 %
EOS (ABSOLUTE): 0.1 x10E3/uL (ref 0.0–0.4)
Eos: 2 %
Hematocrit: 39 % (ref 34.0–46.6)
Hemoglobin: 12.6 g/dL (ref 11.1–15.9)
Immature Grans (Abs): 0 x10E3/uL (ref 0.0–0.1)
Immature Granulocytes: 0 %
Lymphocytes Absolute: 1.1 x10E3/uL (ref 0.7–3.1)
Lymphs: 27 %
MCH: 29.7 pg (ref 26.6–33.0)
MCHC: 32.3 g/dL (ref 31.5–35.7)
MCV: 92 fL (ref 79–97)
Monocytes Absolute: 0.3 x10E3/uL (ref 0.1–0.9)
Monocytes: 8 %
Neutrophils Absolute: 2.6 x10E3/uL (ref 1.4–7.0)
Neutrophils: 62 %
Platelets: 190 x10E3/uL (ref 150–450)
RBC: 4.24 x10E6/uL (ref 3.77–5.28)
RDW: 13.7 % (ref 11.7–15.4)
WBC: 4.1 x10E3/uL (ref 3.4–10.8)

## 2024-02-19 LAB — CMP14+EGFR
ALT: 7 IU/L (ref 0–32)
AST: 15 IU/L (ref 0–40)
Albumin: 4.2 g/dL (ref 3.6–4.6)
Alkaline Phosphatase: 123 IU/L — ABNORMAL HIGH (ref 44–121)
BUN/Creatinine Ratio: 16 (ref 12–28)
BUN: 15 mg/dL (ref 10–36)
Bilirubin Total: 0.4 mg/dL (ref 0.0–1.2)
CO2: 21 mmol/L (ref 20–29)
Calcium: 10.3 mg/dL (ref 8.7–10.3)
Chloride: 106 mmol/L (ref 96–106)
Creatinine, Ser: 0.95 mg/dL (ref 0.57–1.00)
Globulin, Total: 2.4 g/dL (ref 1.5–4.5)
Glucose: 86 mg/dL (ref 70–99)
Potassium: 4.4 mmol/L (ref 3.5–5.2)
Sodium: 141 mmol/L (ref 134–144)
Total Protein: 6.6 g/dL (ref 6.0–8.5)
eGFR: 55 mL/min/1.73 — ABNORMAL LOW (ref >59)

## 2024-02-19 LAB — TSH: TSH: 1.47 u[IU]/mL (ref 0.450–4.500)

## 2024-02-24 ENCOUNTER — Telehealth: Payer: Self-pay

## 2024-02-24 NOTE — Telephone Encounter (Signed)
 Forwarding message to referral team and Dr. Alvia for review.

## 2024-02-24 NOTE — Telephone Encounter (Signed)
 Copied from CRM [id number]. Topic: Referral - Status >> Feb 21, 2024  2:51 PM Diannia H wrote: Reason for CRM: Patients daughter called and stated the her referral needs to be sent to [facility] because that's where her insurance would pay for her to be seen.  [address] [place], [place] [address] Opens 8 AM Mon [phone]

## 2024-02-28 ENCOUNTER — Ambulatory Visit: Payer: Self-pay | Admitting: Family Medicine

## 2024-03-04 ENCOUNTER — Telehealth: Payer: Self-pay | Admitting: Family Medicine

## 2024-03-04 NOTE — Telephone Encounter (Signed)
 Copied from CRM [id number]. Topic: Referral - Request for Referral >> Mar 04, 2024  2:34 PM Carmell R wrote: Did the patient discuss referral with their provider in the last year? Yes (If No - schedule appointment) (If Yes - send message)  Appointment offered? No  Type of order/referral and detailed reason for visit: Dermatologist for spots on face  Preference of office, provider, location: [facility] [address] [place], [place] [address], [phone] This is the only place the insurance will pay.  If referral order, have you been seen by this specialty before? Yes (If Yes, this issue or another issue? When? Where? July 2024  Can we respond through MyChart? Yes

## 2024-08-18 ENCOUNTER — Ambulatory Visit: Admitting: Family Medicine
